# Patient Record
Sex: Male | Born: 1935 | Hispanic: No | Marital: Married | Smoking: Never smoker
Health system: Southern US, Community
[De-identification: ages and names within clinical notes are randomized; demographics above are authoritative.]

## PROBLEM LIST (undated history)

## (undated) DIAGNOSIS — I219 Acute myocardial infarction, unspecified: Secondary | ICD-10-CM

## (undated) DIAGNOSIS — N400 Enlarged prostate without lower urinary tract symptoms: Secondary | ICD-10-CM

## (undated) DIAGNOSIS — I2510 Atherosclerotic heart disease of native coronary artery without angina pectoris: Secondary | ICD-10-CM

## (undated) DIAGNOSIS — I639 Cerebral infarction, unspecified: Secondary | ICD-10-CM

## (undated) DIAGNOSIS — I10 Essential (primary) hypertension: Secondary | ICD-10-CM

## (undated) DIAGNOSIS — R339 Retention of urine, unspecified: Secondary | ICD-10-CM

## (undated) DIAGNOSIS — J90 Pleural effusion, not elsewhere classified: Secondary | ICD-10-CM

## (undated) DIAGNOSIS — I5020 Unspecified systolic (congestive) heart failure: Secondary | ICD-10-CM

## (undated) HISTORY — PX: APPENDECTOMY: SHX54

## (undated) HISTORY — PX: TOTAL HIP ARTHROPLASTY: SHX124

---

## 2010-08-06 ENCOUNTER — Ambulatory Visit (HOSPITAL_BASED_OUTPATIENT_CLINIC_OR_DEPARTMENT_OTHER)
Admission: RE | Admit: 2010-08-06 | Discharge: 2010-08-06 | Disposition: A | Discharge status: Home / Self Care | Payer: Medicare Other | Source: Ambulatory Visit | Attending: Urology | Admitting: Urology

## 2010-08-06 ENCOUNTER — Other Ambulatory Visit: Payer: Self-pay | Admitting: Urology

## 2010-08-06 DIAGNOSIS — N329 Bladder disorder, unspecified: Secondary | ICD-10-CM | POA: Insufficient documentation

## 2010-08-06 DIAGNOSIS — I10 Essential (primary) hypertension: Secondary | ICD-10-CM | POA: Insufficient documentation

## 2010-08-06 DIAGNOSIS — R350 Frequency of micturition: Secondary | ICD-10-CM | POA: Insufficient documentation

## 2010-08-06 DIAGNOSIS — R3915 Urgency of urination: Secondary | ICD-10-CM | POA: Insufficient documentation

## 2010-08-06 DIAGNOSIS — R300 Dysuria: Secondary | ICD-10-CM | POA: Insufficient documentation

## 2010-08-06 DIAGNOSIS — N138 Other obstructive and reflux uropathy: Secondary | ICD-10-CM | POA: Insufficient documentation

## 2010-08-06 DIAGNOSIS — N401 Enlarged prostate with lower urinary tract symptoms: Secondary | ICD-10-CM | POA: Insufficient documentation

## 2010-08-06 DIAGNOSIS — N3289 Other specified disorders of bladder: Secondary | ICD-10-CM | POA: Insufficient documentation

## 2010-08-06 LAB — POCT I-STAT 4, (NA,K, GLUC, HGB,HCT)
Glucose, Bld: 157 mg/dL — ABNORMAL HIGH (ref 70–99)
HCT: 41 % (ref 39.0–52.0)
Hemoglobin: 13.9 g/dL (ref 13.0–17.0)
Potassium: 4 mEq/L (ref 3.5–5.1)
Sodium: 140 mEq/L (ref 135–145)

## 2010-08-06 LAB — GLUCOSE, CAPILLARY: Glucose-Capillary: 168 mg/dL — ABNORMAL HIGH (ref 70–99)

## 2010-08-07 NOTE — Op Note (Signed)
NAME:  Travis Costa, Travis Costa NO.:  [id number]  MEDICAL RECORD NO.:  [id number]  LOCATION:                                 FACILITY:  PHYSICIAN:  Jaimi Belle C. Vernie Ammons, M.D.       DATE OF BIRTH:  DATE OF PROCEDURE:  08/06/2010 DATE OF DISCHARGE:                              OPERATIVE REPORT   PREOPERATIVE DIAGNOSIS:  Bladder lesion.  POSTOPERATIVE DIAGNOSIS:  Bladder lesion.  PROCEDURE:  Transurethral resectional biopsy of bladder lesion.  SURGEON:  Esvin Hnat C. Vernie Ammons, M.D.  ANESTHESIA:  General.  DRAINS:  A 26-French Foley catheter.  SPECIMENS:  Portion of bladder lesion to pathology.  BLOOD LOSS:  Minimal.  COMPLICATIONS:  None.  INDICATIONS:  The patient is an 77 year old who has recently developed increased urgency, frequency and dysuria.  He was treated medically initially but due to persistent symptoms cystoscopy was performed which revealed an area on the floor of the bladder on the right hand side that appeared to be slightly erythematous and the question whether inflammation versus early papillary transitional cell carcinoma was raised.  I therefore have discussed further evaluation of this under anesthesia with resectional biopsy.  The risks, complications and alternatives have been discussed.  The patient understands and has elected to proceed.  DESCRIPTION OF OPERATION:  After informed consent, the patient was brought to the major OR, placed on the table and administered general anesthesia.  He was then moved to the dorsal lithotomy position and his genitalia was sterilely prepped and draped.  An official time-out was then performed.  The 28-French resectoscope sheath with Timberlake obturator was then passed into the bladder and the obturator removed and replaced with the 12 degrees lens and resectoscope element.  I then did a thorough inspection of the bladder.  I noted 3+ trabeculation with some pseudodiverticula/cellules.  The area that I had  seen previously appeared slightly erythematous.  There was also what appeared to be either edema of the bladder neck or early transitional cell carcinoma primarily involving the bladder neck region and floor of the bladder at approximately the 7 to 8 o'clock position.  No other worrisome lesions were found within the bladder.  I resected the worrisome area at the bladder neck and on the floor of the bladder.  I then used the Microvasive evacuator to remove all of the portions of the resected lesion.  He was noted to have significant trilobar hypertrophy with great many superficial prostatic urethral vessels that did bleed secondary to passage of the scope.  I therefore fulgurated the area where I had resected the bladder neck and this controlled bleeding there, however, there was still bleeding from the prostatic urethra itself.  Rather than try to fulgurate all of the prostatic urethra, I elected rather to place a large-bore Foley catheter and placed this on mild traction.  Doing this, I was able to control the bleeding and this was maintained on traction and the patient was awakened and taken to recovery room in stable and satisfactory condition.  He tolerated the procedure well and there were no intraoperative complications.  He will be observed in the recovery room with the catheter in and it will  be irrigated to be sure no clots form.  If it remains clear enough, the catheter could be removed after it has been off traction for a period of time and he will be discharged home.  Otherwise, I may want to leave the catheter in and then remove it when he returns to the office.     Aleda Madl C. Vernie Ammons, M.D.     MCO/MEDQ  D:  08/06/2010  T:  08/06/2010  Job:  [id number]  Electronically Signed by Ihor Gully M.D. on 08/07/2010 10:26:23 AM

## 2011-02-07 DIAGNOSIS — H401290 Low-tension glaucoma, unspecified eye, stage unspecified: Secondary | ICD-10-CM | POA: Diagnosis not present

## 2011-02-14 DIAGNOSIS — N401 Enlarged prostate with lower urinary tract symptoms: Secondary | ICD-10-CM | POA: Diagnosis not present

## 2011-02-14 DIAGNOSIS — R972 Elevated prostate specific antigen [PSA]: Secondary | ICD-10-CM | POA: Diagnosis not present

## 2011-02-14 DIAGNOSIS — R339 Retention of urine, unspecified: Secondary | ICD-10-CM | POA: Diagnosis not present

## 2011-02-15 DIAGNOSIS — H401290 Low-tension glaucoma, unspecified eye, stage unspecified: Secondary | ICD-10-CM | POA: Diagnosis not present

## 2011-05-10 DIAGNOSIS — I10 Essential (primary) hypertension: Secondary | ICD-10-CM | POA: Diagnosis not present

## 2011-05-10 DIAGNOSIS — E119 Type 2 diabetes mellitus without complications: Secondary | ICD-10-CM | POA: Diagnosis not present

## 2011-05-10 DIAGNOSIS — Z79899 Other long term (current) drug therapy: Secondary | ICD-10-CM | POA: Diagnosis not present

## 2011-05-10 DIAGNOSIS — J3089 Other allergic rhinitis: Secondary | ICD-10-CM | POA: Diagnosis not present

## 2011-05-10 DIAGNOSIS — E782 Mixed hyperlipidemia: Secondary | ICD-10-CM | POA: Diagnosis not present

## 2011-08-29 DIAGNOSIS — H401290 Low-tension glaucoma, unspecified eye, stage unspecified: Secondary | ICD-10-CM | POA: Diagnosis not present

## 2011-12-24 DIAGNOSIS — I10 Essential (primary) hypertension: Secondary | ICD-10-CM | POA: Diagnosis not present

## 2011-12-24 DIAGNOSIS — R42 Dizziness and giddiness: Secondary | ICD-10-CM | POA: Diagnosis not present

## 2011-12-24 DIAGNOSIS — E782 Mixed hyperlipidemia: Secondary | ICD-10-CM | POA: Diagnosis not present

## 2011-12-24 DIAGNOSIS — E119 Type 2 diabetes mellitus without complications: Secondary | ICD-10-CM | POA: Diagnosis not present

## 2012-01-02 DIAGNOSIS — H401290 Low-tension glaucoma, unspecified eye, stage unspecified: Secondary | ICD-10-CM | POA: Diagnosis not present

## 2012-04-08 DIAGNOSIS — R972 Elevated prostate specific antigen [PSA]: Secondary | ICD-10-CM | POA: Diagnosis not present

## 2012-04-08 DIAGNOSIS — R39198 Other difficulties with micturition: Secondary | ICD-10-CM | POA: Diagnosis not present

## 2012-04-08 DIAGNOSIS — N401 Enlarged prostate with lower urinary tract symptoms: Secondary | ICD-10-CM | POA: Diagnosis not present

## 2012-04-08 DIAGNOSIS — R339 Retention of urine, unspecified: Secondary | ICD-10-CM | POA: Diagnosis not present

## 2012-04-08 DIAGNOSIS — N138 Other obstructive and reflux uropathy: Secondary | ICD-10-CM | POA: Diagnosis not present

## 2012-05-07 DIAGNOSIS — H401290 Low-tension glaucoma, unspecified eye, stage unspecified: Secondary | ICD-10-CM | POA: Diagnosis not present

## 2012-06-17 DIAGNOSIS — H1010 Acute atopic conjunctivitis, unspecified eye: Secondary | ICD-10-CM | POA: Diagnosis not present

## 2012-06-25 DIAGNOSIS — I10 Essential (primary) hypertension: Secondary | ICD-10-CM | POA: Diagnosis not present

## 2012-06-25 DIAGNOSIS — Z79899 Other long term (current) drug therapy: Secondary | ICD-10-CM | POA: Diagnosis not present

## 2012-06-25 DIAGNOSIS — E119 Type 2 diabetes mellitus without complications: Secondary | ICD-10-CM | POA: Diagnosis not present

## 2012-06-25 DIAGNOSIS — E782 Mixed hyperlipidemia: Secondary | ICD-10-CM | POA: Diagnosis not present

## 2012-07-24 DIAGNOSIS — I10 Essential (primary) hypertension: Secondary | ICD-10-CM | POA: Diagnosis not present

## 2012-07-24 DIAGNOSIS — Z006 Encounter for examination for normal comparison and control in clinical research program: Secondary | ICD-10-CM | POA: Diagnosis not present

## 2012-07-24 DIAGNOSIS — E119 Type 2 diabetes mellitus without complications: Secondary | ICD-10-CM | POA: Diagnosis not present

## 2012-07-24 DIAGNOSIS — Z0000 Encounter for general adult medical examination without abnormal findings: Secondary | ICD-10-CM | POA: Diagnosis not present

## 2012-09-10 DIAGNOSIS — H401290 Low-tension glaucoma, unspecified eye, stage unspecified: Secondary | ICD-10-CM | POA: Diagnosis not present

## 2012-10-22 DIAGNOSIS — I10 Essential (primary) hypertension: Secondary | ICD-10-CM | POA: Diagnosis not present

## 2013-01-11 DIAGNOSIS — H401290 Low-tension glaucoma, unspecified eye, stage unspecified: Secondary | ICD-10-CM | POA: Diagnosis not present

## 2013-04-13 DIAGNOSIS — N138 Other obstructive and reflux uropathy: Secondary | ICD-10-CM | POA: Diagnosis not present

## 2013-04-13 DIAGNOSIS — N401 Enlarged prostate with lower urinary tract symptoms: Secondary | ICD-10-CM | POA: Diagnosis not present

## 2013-04-13 DIAGNOSIS — R972 Elevated prostate specific antigen [PSA]: Secondary | ICD-10-CM | POA: Diagnosis not present

## 2013-04-29 DIAGNOSIS — E782 Mixed hyperlipidemia: Secondary | ICD-10-CM | POA: Diagnosis not present

## 2013-04-29 DIAGNOSIS — K219 Gastro-esophageal reflux disease without esophagitis: Secondary | ICD-10-CM | POA: Diagnosis not present

## 2013-04-29 DIAGNOSIS — Z79899 Other long term (current) drug therapy: Secondary | ICD-10-CM | POA: Diagnosis not present

## 2013-04-29 DIAGNOSIS — I10 Essential (primary) hypertension: Secondary | ICD-10-CM | POA: Diagnosis not present

## 2013-04-29 DIAGNOSIS — Z23 Encounter for immunization: Secondary | ICD-10-CM | POA: Diagnosis not present

## 2013-04-29 DIAGNOSIS — IMO0001 Reserved for inherently not codable concepts without codable children: Secondary | ICD-10-CM | POA: Diagnosis not present

## 2013-05-13 DIAGNOSIS — H401290 Low-tension glaucoma, unspecified eye, stage unspecified: Secondary | ICD-10-CM | POA: Diagnosis not present

## 2013-08-31 DIAGNOSIS — E782 Mixed hyperlipidemia: Secondary | ICD-10-CM | POA: Diagnosis not present

## 2013-08-31 DIAGNOSIS — Z0000 Encounter for general adult medical examination without abnormal findings: Secondary | ICD-10-CM | POA: Diagnosis not present

## 2013-08-31 DIAGNOSIS — IMO0001 Reserved for inherently not codable concepts without codable children: Secondary | ICD-10-CM | POA: Diagnosis not present

## 2013-08-31 DIAGNOSIS — I10 Essential (primary) hypertension: Secondary | ICD-10-CM | POA: Diagnosis not present

## 2013-09-09 DIAGNOSIS — H401290 Low-tension glaucoma, unspecified eye, stage unspecified: Secondary | ICD-10-CM | POA: Diagnosis not present

## 2013-12-03 DIAGNOSIS — S61412A Laceration without foreign body of left hand, initial encounter: Secondary | ICD-10-CM | POA: Diagnosis not present

## 2014-02-16 DIAGNOSIS — M179 Osteoarthritis of knee, unspecified: Secondary | ICD-10-CM | POA: Diagnosis not present

## 2014-03-14 DIAGNOSIS — H401231 Low-tension glaucoma, bilateral, mild stage: Secondary | ICD-10-CM | POA: Diagnosis not present

## 2014-03-25 DIAGNOSIS — E782 Mixed hyperlipidemia: Secondary | ICD-10-CM | POA: Diagnosis not present

## 2014-03-25 DIAGNOSIS — I10 Essential (primary) hypertension: Secondary | ICD-10-CM | POA: Diagnosis not present

## 2014-03-25 DIAGNOSIS — E1165 Type 2 diabetes mellitus with hyperglycemia: Secondary | ICD-10-CM | POA: Diagnosis not present

## 2014-03-25 DIAGNOSIS — K219 Gastro-esophageal reflux disease without esophagitis: Secondary | ICD-10-CM | POA: Diagnosis not present

## 2014-03-25 DIAGNOSIS — Z79899 Other long term (current) drug therapy: Secondary | ICD-10-CM | POA: Diagnosis not present

## 2014-08-01 DIAGNOSIS — R42 Dizziness and giddiness: Secondary | ICD-10-CM | POA: Diagnosis not present

## 2014-08-01 DIAGNOSIS — I10 Essential (primary) hypertension: Secondary | ICD-10-CM | POA: Diagnosis not present

## 2014-08-15 DIAGNOSIS — E1165 Type 2 diabetes mellitus with hyperglycemia: Secondary | ICD-10-CM | POA: Diagnosis not present

## 2014-08-15 DIAGNOSIS — E1169 Type 2 diabetes mellitus with other specified complication: Secondary | ICD-10-CM | POA: Diagnosis not present

## 2014-08-15 DIAGNOSIS — I10 Essential (primary) hypertension: Secondary | ICD-10-CM | POA: Diagnosis not present

## 2014-08-15 DIAGNOSIS — Z79899 Other long term (current) drug therapy: Secondary | ICD-10-CM | POA: Diagnosis not present

## 2014-09-13 DIAGNOSIS — H401231 Low-tension glaucoma, bilateral, mild stage: Secondary | ICD-10-CM | POA: Diagnosis not present

## 2014-09-15 DIAGNOSIS — I219 Acute myocardial infarction, unspecified: Secondary | ICD-10-CM

## 2014-09-15 HISTORY — DX: Acute myocardial infarction, unspecified: I21.9

## 2014-09-18 DIAGNOSIS — R338 Other retention of urine: Secondary | ICD-10-CM | POA: Diagnosis present

## 2014-09-18 DIAGNOSIS — S70221A Blister (nonthermal), right hip, initial encounter: Secondary | ICD-10-CM | POA: Diagnosis present

## 2014-09-18 DIAGNOSIS — Z8673 Personal history of transient ischemic attack (TIA), and cerebral infarction without residual deficits: Secondary | ICD-10-CM | POA: Diagnosis not present

## 2014-09-18 DIAGNOSIS — S72142A Displaced intertrochanteric fracture of left femur, initial encounter for closed fracture: Secondary | ICD-10-CM | POA: Diagnosis not present

## 2014-09-18 DIAGNOSIS — M25551 Pain in right hip: Secondary | ICD-10-CM | POA: Diagnosis not present

## 2014-09-18 DIAGNOSIS — Z043 Encounter for examination and observation following other accident: Secondary | ICD-10-CM | POA: Diagnosis not present

## 2014-09-18 DIAGNOSIS — I340 Nonrheumatic mitral (valve) insufficiency: Secondary | ICD-10-CM | POA: Diagnosis not present

## 2014-09-18 DIAGNOSIS — E40 Kwashiorkor: Secondary | ICD-10-CM | POA: Diagnosis not present

## 2014-09-18 DIAGNOSIS — R7989 Other specified abnormal findings of blood chemistry: Secondary | ICD-10-CM | POA: Diagnosis not present

## 2014-09-18 DIAGNOSIS — I771 Stricture of artery: Secondary | ICD-10-CM | POA: Diagnosis not present

## 2014-09-18 DIAGNOSIS — G9389 Other specified disorders of brain: Secondary | ICD-10-CM | POA: Diagnosis not present

## 2014-09-18 DIAGNOSIS — I639 Cerebral infarction, unspecified: Secondary | ICD-10-CM | POA: Diagnosis not present

## 2014-09-18 DIAGNOSIS — R312 Other microscopic hematuria: Secondary | ICD-10-CM | POA: Diagnosis not present

## 2014-09-18 DIAGNOSIS — I10 Essential (primary) hypertension: Secondary | ICD-10-CM | POA: Diagnosis present

## 2014-09-18 DIAGNOSIS — I249 Acute ischemic heart disease, unspecified: Secondary | ICD-10-CM | POA: Diagnosis not present

## 2014-09-18 DIAGNOSIS — E440 Moderate protein-calorie malnutrition: Secondary | ICD-10-CM | POA: Diagnosis present

## 2014-09-18 DIAGNOSIS — I491 Atrial premature depolarization: Secondary | ICD-10-CM | POA: Diagnosis not present

## 2014-09-18 DIAGNOSIS — N401 Enlarged prostate with lower urinary tract symptoms: Secondary | ICD-10-CM | POA: Diagnosis present

## 2014-09-18 DIAGNOSIS — I6782 Cerebral ischemia: Secondary | ICD-10-CM | POA: Diagnosis not present

## 2014-09-18 DIAGNOSIS — E119 Type 2 diabetes mellitus without complications: Secondary | ICD-10-CM | POA: Diagnosis present

## 2014-09-18 DIAGNOSIS — R9431 Abnormal electrocardiogram [ECG] [EKG]: Secondary | ICD-10-CM | POA: Diagnosis not present

## 2014-09-18 DIAGNOSIS — I2510 Atherosclerotic heart disease of native coronary artery without angina pectoris: Secondary | ICD-10-CM | POA: Diagnosis not present

## 2014-09-18 DIAGNOSIS — R918 Other nonspecific abnormal finding of lung field: Secondary | ICD-10-CM | POA: Diagnosis not present

## 2014-09-18 DIAGNOSIS — I6522 Occlusion and stenosis of left carotid artery: Secondary | ICD-10-CM | POA: Diagnosis present

## 2014-09-18 DIAGNOSIS — G319 Degenerative disease of nervous system, unspecified: Secondary | ICD-10-CM | POA: Diagnosis not present

## 2014-09-18 DIAGNOSIS — R54 Age-related physical debility: Secondary | ICD-10-CM | POA: Diagnosis not present

## 2014-09-18 DIAGNOSIS — T148 Other injury of unspecified body region: Secondary | ICD-10-CM | POA: Diagnosis not present

## 2014-09-18 DIAGNOSIS — I214 Non-ST elevation (NSTEMI) myocardial infarction: Secondary | ICD-10-CM | POA: Diagnosis not present

## 2014-09-18 DIAGNOSIS — S80811A Abrasion, right lower leg, initial encounter: Secondary | ICD-10-CM | POA: Diagnosis not present

## 2014-09-18 DIAGNOSIS — R52 Pain, unspecified: Secondary | ICD-10-CM | POA: Diagnosis not present

## 2014-09-18 DIAGNOSIS — Z6823 Body mass index (BMI) 23.0-23.9, adult: Secondary | ICD-10-CM | POA: Diagnosis not present

## 2014-09-18 DIAGNOSIS — S72141A Displaced intertrochanteric fracture of right femur, initial encounter for closed fracture: Secondary | ICD-10-CM | POA: Diagnosis not present

## 2014-09-18 DIAGNOSIS — S79911A Unspecified injury of right hip, initial encounter: Secondary | ICD-10-CM | POA: Diagnosis not present

## 2014-09-18 DIAGNOSIS — R7889 Finding of other specified substances, not normally found in blood: Secondary | ICD-10-CM | POA: Diagnosis not present

## 2014-09-18 DIAGNOSIS — R509 Fever, unspecified: Secondary | ICD-10-CM | POA: Diagnosis not present

## 2014-09-23 DIAGNOSIS — S72142A Displaced intertrochanteric fracture of left femur, initial encounter for closed fracture: Secondary | ICD-10-CM | POA: Diagnosis not present

## 2014-09-27 DIAGNOSIS — I77810 Thoracic aortic ectasia: Secondary | ICD-10-CM | POA: Diagnosis not present

## 2014-09-27 DIAGNOSIS — I700 Atherosclerosis of aorta: Secondary | ICD-10-CM | POA: Diagnosis not present

## 2014-09-27 DIAGNOSIS — I959 Hypotension, unspecified: Secondary | ICD-10-CM | POA: Diagnosis present

## 2014-09-27 DIAGNOSIS — R310 Gross hematuria: Secondary | ICD-10-CM | POA: Diagnosis not present

## 2014-09-27 DIAGNOSIS — R000 Tachycardia, unspecified: Secondary | ICD-10-CM | POA: Diagnosis not present

## 2014-09-27 DIAGNOSIS — Z9181 History of falling: Secondary | ICD-10-CM | POA: Diagnosis not present

## 2014-09-27 DIAGNOSIS — D62 Acute posthemorrhagic anemia: Secondary | ICD-10-CM | POA: Diagnosis not present

## 2014-09-27 DIAGNOSIS — N390 Urinary tract infection, site not specified: Secondary | ICD-10-CM | POA: Diagnosis not present

## 2014-09-27 DIAGNOSIS — Z09 Encounter for follow-up examination after completed treatment for conditions other than malignant neoplasm: Secondary | ICD-10-CM | POA: Diagnosis not present

## 2014-09-27 DIAGNOSIS — I6522 Occlusion and stenosis of left carotid artery: Secondary | ICD-10-CM | POA: Diagnosis present

## 2014-09-27 DIAGNOSIS — I771 Stricture of artery: Secondary | ICD-10-CM | POA: Diagnosis not present

## 2014-09-27 DIAGNOSIS — E871 Hypo-osmolality and hyponatremia: Secondary | ICD-10-CM | POA: Diagnosis present

## 2014-09-27 DIAGNOSIS — J90 Pleural effusion, not elsewhere classified: Secondary | ICD-10-CM | POA: Diagnosis present

## 2014-09-27 DIAGNOSIS — J9811 Atelectasis: Secondary | ICD-10-CM | POA: Diagnosis not present

## 2014-09-27 DIAGNOSIS — I491 Atrial premature depolarization: Secondary | ICD-10-CM | POA: Diagnosis not present

## 2014-09-27 DIAGNOSIS — R339 Retention of urine, unspecified: Secondary | ICD-10-CM | POA: Diagnosis not present

## 2014-09-27 DIAGNOSIS — N209 Urinary calculus, unspecified: Secondary | ICD-10-CM | POA: Diagnosis not present

## 2014-09-27 DIAGNOSIS — A419 Sepsis, unspecified organism: Secondary | ICD-10-CM | POA: Diagnosis not present

## 2014-09-27 DIAGNOSIS — E119 Type 2 diabetes mellitus without complications: Secondary | ICD-10-CM | POA: Diagnosis not present

## 2014-09-27 DIAGNOSIS — I5022 Chronic systolic (congestive) heart failure: Secondary | ICD-10-CM | POA: Diagnosis not present

## 2014-09-27 DIAGNOSIS — R0602 Shortness of breath: Secondary | ICD-10-CM | POA: Diagnosis not present

## 2014-09-27 DIAGNOSIS — M7989 Other specified soft tissue disorders: Secondary | ICD-10-CM | POA: Diagnosis not present

## 2014-09-27 DIAGNOSIS — R319 Hematuria, unspecified: Secondary | ICD-10-CM | POA: Diagnosis not present

## 2014-09-27 DIAGNOSIS — S72143A Displaced intertrochanteric fracture of unspecified femur, initial encounter for closed fracture: Secondary | ICD-10-CM | POA: Diagnosis not present

## 2014-09-27 DIAGNOSIS — N401 Enlarged prostate with lower urinary tract symptoms: Secondary | ICD-10-CM | POA: Diagnosis present

## 2014-09-27 DIAGNOSIS — Z8673 Personal history of transient ischemic attack (TIA), and cerebral infarction without residual deficits: Secondary | ICD-10-CM | POA: Diagnosis not present

## 2014-09-27 DIAGNOSIS — S72141A Displaced intertrochanteric fracture of right femur, initial encounter for closed fracture: Secondary | ICD-10-CM | POA: Diagnosis not present

## 2014-09-27 DIAGNOSIS — I2510 Atherosclerotic heart disease of native coronary artery without angina pectoris: Secondary | ICD-10-CM | POA: Diagnosis present

## 2014-09-27 DIAGNOSIS — S72144D Nondisplaced intertrochanteric fracture of right femur, subsequent encounter for closed fracture with routine healing: Secondary | ICD-10-CM | POA: Diagnosis not present

## 2014-09-27 DIAGNOSIS — I2109 ST elevation (STEMI) myocardial infarction involving other coronary artery of anterior wall: Secondary | ICD-10-CM | POA: Diagnosis not present

## 2014-09-27 DIAGNOSIS — I493 Ventricular premature depolarization: Secondary | ICD-10-CM | POA: Diagnosis not present

## 2014-09-27 DIAGNOSIS — R9431 Abnormal electrocardiogram [ECG] [EKG]: Secondary | ICD-10-CM | POA: Diagnosis not present

## 2014-09-27 DIAGNOSIS — R509 Fever, unspecified: Secondary | ICD-10-CM | POA: Diagnosis not present

## 2014-09-27 DIAGNOSIS — D649 Anemia, unspecified: Secondary | ICD-10-CM | POA: Diagnosis present

## 2014-09-27 DIAGNOSIS — Z5189 Encounter for other specified aftercare: Secondary | ICD-10-CM | POA: Diagnosis not present

## 2014-09-27 DIAGNOSIS — I255 Ischemic cardiomyopathy: Secondary | ICD-10-CM | POA: Diagnosis present

## 2014-09-27 DIAGNOSIS — N3000 Acute cystitis without hematuria: Secondary | ICD-10-CM | POA: Diagnosis not present

## 2014-09-27 DIAGNOSIS — J811 Chronic pulmonary edema: Secondary | ICD-10-CM | POA: Diagnosis not present

## 2014-09-27 DIAGNOSIS — S72009A Fracture of unspecified part of neck of unspecified femur, initial encounter for closed fracture: Secondary | ICD-10-CM | POA: Diagnosis not present

## 2014-09-27 DIAGNOSIS — R938 Abnormal findings on diagnostic imaging of other specified body structures: Secondary | ICD-10-CM | POA: Diagnosis not present

## 2014-09-27 DIAGNOSIS — R918 Other nonspecific abnormal finding of lung field: Secondary | ICD-10-CM | POA: Diagnosis not present

## 2014-09-27 DIAGNOSIS — J984 Other disorders of lung: Secondary | ICD-10-CM | POA: Diagnosis not present

## 2014-09-27 DIAGNOSIS — I10 Essential (primary) hypertension: Secondary | ICD-10-CM | POA: Diagnosis present

## 2014-09-27 DIAGNOSIS — I509 Heart failure, unspecified: Secondary | ICD-10-CM | POA: Diagnosis not present

## 2014-09-27 DIAGNOSIS — R091 Pleurisy: Secondary | ICD-10-CM | POA: Diagnosis not present

## 2014-09-27 DIAGNOSIS — R338 Other retention of urine: Secondary | ICD-10-CM | POA: Diagnosis not present

## 2014-09-27 DIAGNOSIS — I214 Non-ST elevation (NSTEMI) myocardial infarction: Secondary | ICD-10-CM | POA: Diagnosis not present

## 2014-09-27 DIAGNOSIS — F0390 Unspecified dementia without behavioral disturbance: Secondary | ICD-10-CM | POA: Diagnosis present

## 2014-09-27 DIAGNOSIS — E1165 Type 2 diabetes mellitus with hyperglycemia: Secondary | ICD-10-CM | POA: Diagnosis present

## 2014-09-27 DIAGNOSIS — I5023 Acute on chronic systolic (congestive) heart failure: Secondary | ICD-10-CM | POA: Diagnosis not present

## 2014-10-14 DIAGNOSIS — E119 Type 2 diabetes mellitus without complications: Secondary | ICD-10-CM | POA: Diagnosis present

## 2014-10-14 DIAGNOSIS — I6522 Occlusion and stenosis of left carotid artery: Secondary | ICD-10-CM | POA: Diagnosis present

## 2014-10-14 DIAGNOSIS — R319 Hematuria, unspecified: Secondary | ICD-10-CM | POA: Diagnosis not present

## 2014-10-14 DIAGNOSIS — N401 Enlarged prostate with lower urinary tract symptoms: Secondary | ICD-10-CM | POA: Diagnosis present

## 2014-10-14 DIAGNOSIS — I214 Non-ST elevation (NSTEMI) myocardial infarction: Secondary | ICD-10-CM | POA: Diagnosis present

## 2014-10-14 DIAGNOSIS — Z7982 Long term (current) use of aspirin: Secondary | ICD-10-CM | POA: Diagnosis not present

## 2014-10-14 DIAGNOSIS — I69198 Other sequelae of nontraumatic intracerebral hemorrhage: Secondary | ICD-10-CM | POA: Diagnosis not present

## 2014-10-14 DIAGNOSIS — E7800 Pure hypercholesterolemia, unspecified: Secondary | ICD-10-CM | POA: Diagnosis present

## 2014-10-14 DIAGNOSIS — Z8673 Personal history of transient ischemic attack (TIA), and cerebral infarction without residual deficits: Secondary | ICD-10-CM | POA: Diagnosis not present

## 2014-10-14 DIAGNOSIS — G9389 Other specified disorders of brain: Secondary | ICD-10-CM | POA: Diagnosis present

## 2014-10-14 DIAGNOSIS — I2510 Atherosclerotic heart disease of native coronary artery without angina pectoris: Secondary | ICD-10-CM | POA: Diagnosis present

## 2014-10-14 DIAGNOSIS — R338 Other retention of urine: Secondary | ICD-10-CM | POA: Diagnosis present

## 2014-10-14 DIAGNOSIS — D62 Acute posthemorrhagic anemia: Secondary | ICD-10-CM | POA: Diagnosis present

## 2014-10-14 DIAGNOSIS — I5022 Chronic systolic (congestive) heart failure: Secondary | ICD-10-CM | POA: Diagnosis present

## 2014-10-14 DIAGNOSIS — I10 Essential (primary) hypertension: Secondary | ICD-10-CM | POA: Diagnosis present

## 2014-10-16 ENCOUNTER — Encounter (HOSPITAL_COMMUNITY): Payer: Self-pay | Admitting: Internal Medicine

## 2014-10-16 ENCOUNTER — Inpatient Hospital Stay (HOSPITAL_COMMUNITY)
Admission: AD | Admit: 2014-10-16 | Discharge: 2014-10-21 | DRG: 726 | Disposition: A | Discharge status: Home Health | Payer: Medicare Other | Source: Other Acute Inpatient Hospital | Attending: Internal Medicine | Admitting: Internal Medicine

## 2014-10-16 DIAGNOSIS — N202 Calculus of kidney with calculus of ureter: Secondary | ICD-10-CM | POA: Diagnosis not present

## 2014-10-16 DIAGNOSIS — R05 Cough: Secondary | ICD-10-CM

## 2014-10-16 DIAGNOSIS — I10 Essential (primary) hypertension: Secondary | ICD-10-CM | POA: Diagnosis not present

## 2014-10-16 DIAGNOSIS — I5020 Unspecified systolic (congestive) heart failure: Secondary | ICD-10-CM | POA: Diagnosis present

## 2014-10-16 DIAGNOSIS — I2510 Atherosclerotic heart disease of native coronary artery without angina pectoris: Secondary | ICD-10-CM | POA: Diagnosis present

## 2014-10-16 DIAGNOSIS — Z885 Allergy status to narcotic agent status: Secondary | ICD-10-CM

## 2014-10-16 DIAGNOSIS — R059 Cough, unspecified: Secondary | ICD-10-CM

## 2014-10-16 DIAGNOSIS — I252 Old myocardial infarction: Secondary | ICD-10-CM | POA: Diagnosis not present

## 2014-10-16 DIAGNOSIS — Z96641 Presence of right artificial hip joint: Secondary | ICD-10-CM | POA: Diagnosis present

## 2014-10-16 DIAGNOSIS — Z8701 Personal history of pneumonia (recurrent): Secondary | ICD-10-CM

## 2014-10-16 DIAGNOSIS — I639 Cerebral infarction, unspecified: Secondary | ICD-10-CM | POA: Diagnosis present

## 2014-10-16 DIAGNOSIS — N400 Enlarged prostate without lower urinary tract symptoms: Secondary | ICD-10-CM | POA: Diagnosis not present

## 2014-10-16 DIAGNOSIS — Z833 Family history of diabetes mellitus: Secondary | ICD-10-CM | POA: Diagnosis not present

## 2014-10-16 DIAGNOSIS — I959 Hypotension, unspecified: Secondary | ICD-10-CM | POA: Diagnosis present

## 2014-10-16 DIAGNOSIS — R339 Retention of urine, unspecified: Secondary | ICD-10-CM | POA: Diagnosis present

## 2014-10-16 DIAGNOSIS — I9589 Other hypotension: Secondary | ICD-10-CM

## 2014-10-16 DIAGNOSIS — Z8673 Personal history of transient ischemic attack (TIA), and cerebral infarction without residual deficits: Secondary | ICD-10-CM | POA: Diagnosis not present

## 2014-10-16 DIAGNOSIS — D62 Acute posthemorrhagic anemia: Secondary | ICD-10-CM | POA: Insufficient documentation

## 2014-10-16 DIAGNOSIS — I5022 Chronic systolic (congestive) heart failure: Secondary | ICD-10-CM | POA: Diagnosis present

## 2014-10-16 DIAGNOSIS — R338 Other retention of urine: Secondary | ICD-10-CM | POA: Diagnosis not present

## 2014-10-16 DIAGNOSIS — I110 Hypertensive heart disease with heart failure: Secondary | ICD-10-CM | POA: Diagnosis present

## 2014-10-16 DIAGNOSIS — N401 Enlarged prostate with lower urinary tract symptoms: Principal | ICD-10-CM | POA: Diagnosis present

## 2014-10-16 DIAGNOSIS — N390 Urinary tract infection, site not specified: Secondary | ICD-10-CM | POA: Diagnosis not present

## 2014-10-16 DIAGNOSIS — E119 Type 2 diabetes mellitus without complications: Secondary | ICD-10-CM | POA: Diagnosis present

## 2014-10-16 DIAGNOSIS — R319 Hematuria, unspecified: Secondary | ICD-10-CM | POA: Diagnosis present

## 2014-10-16 DIAGNOSIS — R310 Gross hematuria: Secondary | ICD-10-CM

## 2014-10-16 DIAGNOSIS — R972 Elevated prostate specific antigen [PSA]: Secondary | ICD-10-CM | POA: Diagnosis not present

## 2014-10-16 DIAGNOSIS — I6350 Cerebral infarction due to unspecified occlusion or stenosis of unspecified cerebral artery: Secondary | ICD-10-CM | POA: Diagnosis not present

## 2014-10-16 DIAGNOSIS — Z8249 Family history of ischemic heart disease and other diseases of the circulatory system: Secondary | ICD-10-CM | POA: Diagnosis not present

## 2014-10-16 HISTORY — DX: Cerebral infarction, unspecified: I63.9

## 2014-10-16 HISTORY — DX: Atherosclerotic heart disease of native coronary artery without angina pectoris: I25.10

## 2014-10-16 HISTORY — DX: Retention of urine, unspecified: R33.9

## 2014-10-16 HISTORY — DX: Unspecified systolic (congestive) heart failure: I50.20

## 2014-10-16 HISTORY — DX: Pleural effusion, not elsewhere classified: J90

## 2014-10-16 HISTORY — DX: Essential (primary) hypertension: I10

## 2014-10-16 HISTORY — DX: Benign prostatic hyperplasia without lower urinary tract symptoms: N40.0

## 2014-10-16 MED ORDER — SODIUM CHLORIDE 0.9 % IJ SOLN
3.0000 mL | Freq: Two times a day (BID) | INTRAMUSCULAR | Status: DC
  Administered 2014-10-17 – 2014-10-21 (×8): 3 mL via INTRAVENOUS

## 2014-10-16 MED ORDER — HYDROMORPHONE HCL 1 MG/ML IJ SOLN: 0.5000 mg | INTRAMUSCULAR | Status: DC | PRN

## 2014-10-16 MED ORDER — DEXTROSE 5 % IV SOLN
1.0000 g | INTRAVENOUS | Status: AC
  Administered 2014-10-17: 1 g via INTRAVENOUS
  Filled 2014-10-16: qty 10

## 2014-10-16 MED ORDER — ONDANSETRON HCL 4 MG PO TABS: 4.0000 mg | ORAL_TABLET | Freq: Four times a day (QID) | ORAL | Status: DC | PRN

## 2014-10-16 MED ORDER — SODIUM CHLORIDE 0.9 % IV SOLN
INTRAVENOUS | Status: DC
  Administered 2014-10-17: 500 mL via INTRAVENOUS

## 2014-10-16 MED ORDER — SODIUM CHLORIDE 0.9 % IV SOLN
INTRAVENOUS | Status: AC
  Administered 2014-10-17: via INTRAVENOUS

## 2014-10-16 MED ORDER — ACETAMINOPHEN 325 MG PO TABS
650.0000 mg | ORAL_TABLET | Freq: Four times a day (QID) | ORAL | Status: DC | PRN
  Administered 2014-10-17 – 2014-10-20 (×4): 650 mg via ORAL
  Filled 2014-10-16 (×4): qty 2

## 2014-10-16 MED ORDER — ALUM & MAG HYDROXIDE-SIMETH 200-200-20 MG/5ML PO SUSP: 30.0000 mL | Freq: Four times a day (QID) | ORAL | Status: DC | PRN

## 2014-10-16 MED ORDER — INSULIN ASPART 100 UNIT/ML ~~LOC~~ SOLN
0.0000 [IU] | SUBCUTANEOUS | Status: DC
  Administered 2014-10-17 (×2): 1 [IU] via SUBCUTANEOUS
  Administered 2014-10-17 (×3): 2 [IU] via SUBCUTANEOUS
  Administered 2014-10-18 (×3): 1 [IU] via SUBCUTANEOUS
  Administered 2014-10-18 – 2014-10-19 (×2): 2 [IU] via SUBCUTANEOUS
  Administered 2014-10-19 (×3): 1 [IU] via SUBCUTANEOUS
  Administered 2014-10-19: 2 [IU] via SUBCUTANEOUS
  Administered 2014-10-20: 3 [IU] via SUBCUTANEOUS
  Administered 2014-10-20: 1 [IU] via SUBCUTANEOUS
  Administered 2014-10-20: 2 [IU] via SUBCUTANEOUS
  Administered 2014-10-20: 1 [IU] via SUBCUTANEOUS
  Administered 2014-10-20 (×2): 2 [IU] via SUBCUTANEOUS
  Administered 2014-10-21: 1 [IU] via SUBCUTANEOUS

## 2014-10-16 MED ORDER — ACETAMINOPHEN 650 MG RE SUPP: 650.0000 mg | Freq: Four times a day (QID) | RECTAL | Status: DC | PRN

## 2014-10-16 MED ORDER — ONDANSETRON HCL 4 MG/2ML IJ SOLN: 4.0000 mg | Freq: Four times a day (QID) | INTRAMUSCULAR | Status: DC | PRN

## 2014-10-16 MED ORDER — SODIUM CHLORIDE 0.9 % IV BOLUS (SEPSIS)
500.0000 mL | Freq: Once | INTRAVENOUS | Status: AC
  Administered 2014-10-17: 500 mL via INTRAVENOUS

## 2014-10-16 NOTE — Consult Note (Signed)
Reason for Consult: Gross Hematuria, Urinary Retention, Elevated PSA  Referring Physician: Oather Costa is an 81 y.o. male.   HPI:   1 - Urinary Retention - new retention after Rt hip surgery 09/19/14. He has h/o on / off retention x years and is s/p prostate needle ablation previously, on tamsulosin at baseline. Started on finasteride 9/27. DRE 80gm+.   2- Gross Hematuria - new gross hematuria with catheter in place following several catheters peri-op around right hip surgery 09/2014. No blood thinners. Three way catheter placed by URology MD at hospital in [facility] and started on finasteride for likely prostatic-source bleeding. No recent abd-pelvis imaging.   3 - Elevated PSA - long h/o elevated PSA since 1990s. Negative BX 1994 and 1998. PSA 17 2015, 14 in 2015, 11 in 2013. DRE this admission 80GM +, non-nodular.  PMH sig for Rt hip fractrue, NSTEMI, CVA (minimal deficits). He is independent at baseline and still works / volunteers few [place] per week.   Today " [place] " is seen in consulatiot for above. He has seen Dr. Karsten Ro on / off previously, but not since 2014.   No past medical history on file.  No past surgical history on file.  No family history on file.  Social History:  has no tobacco, alcohol, and drug history on file.  Allergies: Allergies not on file  Medications: reveiwed as per epic EMR.   No results found for this or any previous visit (from the past 48 hour(s)).  No results found.  Review of Systems  Constitutional: Negative.  Negative for fever and chills.  Eyes: Negative.   Respiratory: Negative.   Cardiovascular: Negative.   Gastrointestinal: Negative.   Genitourinary: Positive for hematuria. Negative for flank pain.  Musculoskeletal: Negative.   Skin: Negative.   Neurological: Negative.   Endo/Heme/Allergies: Negative.  Does not bruise/bleed easily.  Psychiatric/Behavioral: Negative.    Blood pressure 98/62, pulse 93, temperature  97.9 F (36.6 C), temperature source Oral, resp. rate 16, height 5\' 10"  (1.778 m), weight 73.3 kg (161 lb 9.6 oz), SpO2 100 %. Physical Exam  Constitutional: He appears well-developed.  Very mentally sharp for age. Family at bedside  HENT:  Head: Normocephalic.  Eyes: Pupils are equal, round, and reactive to light.  Neck: Normal range of motion.  Cardiovascular: Normal rate.   Respiratory: Effort normal.  GI: Soft.  Genitourinary:  3 way foley in place with light pink urine at slow drip (1 drop per second) DRE 80gm +, smooth  Musculoskeletal: Normal range of motion.  Neurological: He is alert.  Skin: Skin is warm.  Psychiatric: He has a normal mood and affect. His behavior is normal. Judgment and thought content normal.    Assessment/Plan:  1 - Urinary Retention - likely from BPH exacerbated by antispasmodic / anticholinergic effects of recent post-op meds. Agree with tamsulosin + finaseride and consider repeat trial of void in 1-2 weeks (will take few weeks for finasteride to begin to have effect).  2- Gross Hematuria - likleky form large, friable prostate in setting of multiple recent catheters. Urine clearing nicely on very slow irrigation, will try to wean to off. CT abd-pelvis with and without to w/o large GU stone / mass.   Consider transfusion as anemic and with some hypotension / tachycardia, though no frank orthostasis on exam.   3 - Elevated PSA - PSA today to rule out extreme elevation (hundreds), though again likely related to BPH.  We will make Dr. Karsten Ro  aware of pt's admission.    Jaheem Hedgepath 10/16/2014, 11:32 PM

## 2014-10-16 NOTE — Progress Notes (Signed)
81 year old gentleman with  h/o hypertension, had a mechanical fall and presented to [facility] with a hip fracture,  He underwent repair and post op developed a small CVA without any residual deficits. He was later admitted to inpatient rehabilitation, during which he developed urinary retention and hematuria. Urology consulted and foley catheter placed. But he had persistent hematuria and had to admitted back to inpatient side for CBI.  Family requested transfer to [facility] hospital for CBI and further evaluation and management for Hematuria, as pt's primary urologist is Dr Karsten Ro.   Dr Jeffie Pollock on call was called, requested medical admission because of his recent medical issues and he will see the patient in consultation when he arrives.   Patient's VSS and requested transfer to med surg bed.    Travis Costa [id number]

## 2014-10-16 NOTE — H&P (Signed)
[facility] Admission History and Physical       Travis Costa:[id number] DOB: [birth year] DOA: 10/16/2014  Referring physician:  PCP: No primary care provider on file.  Specialists:   Chief Complaint: Urinary Retention with Bloody Urine and Passing Clotts  HPI: Travis Costa is a 81 y.o. male with a history of Previous CVA without residual deficits in 1990, Recent NSTEMI and Small CVA and Thoracentesis for a pleural Effusion following a  Right THA  (on 09/19/2014) after a Mechanical Fall, and history of  HTN and BPH who had been admitted to [facility] and had a Right THA and was subsequently admitted to inpatient rehab.   He developed urinary retention and hematuria and Urology at [facility] was consulted and placed a foley catheter with difficulty and Urine cultures were also sent, adn he was placed on IV Rocephin.   He was placed on CBI but continued to have hematuria and pass clotts per family.  So his family requested transfer to [facility]  For evaluation adn treatement by his regular Urologist Dr Karsten Ro who has taken care of him for the past 20 years.   The case had been discussed with Dr Jeffie Pollock of [facility] when the arrangements were made for transfer.   After Arrival Urology on-Call was notified for CBI orders and Urology is to see the paitent in the AM.      Review of Systems: Unable to Obtain from the Patient   Past Medical History  Diagnosis Date  . Benign essential HTN   . CAD (coronary artery disease)   . CVA (cerebral infarction)     1990  . BPH (benign prostatic hyperplasia)   . Urinary retention   . Systolic CHF ([place])   . Pleural effusion     Once in past S/P Thoracentesis     Past Surgical History  Procedure Laterality Date  . Total hip arthroplasty Right     09/19/2014  . Appendectomy        Prior to Admission medications   Not on File     Allergies  Allergen Reactions  . Percocet [Oxycodone-Acetaminophen]  Other (See Comments)    Confusion    Social History:  has no tobacco, alcohol, and drug history on file.    Family History  Problem Relation Age of Onset  . CAD Mother   . Diabetes Mother   . Diabetes Father        Physical Exam:  GEN:  Pleasant Drowsy but Arousable  Elderly Well Developed  81 y.o. Caucasian male examined and in no acute distress; cooperative with exam Filed Vitals:   10/16/14 2211  BP: 98/62  Pulse: 93  Temp: 97.9 F (36.6 C)  TempSrc: Oral  Resp: 16  Height: 5\' 10"  (1.778 m)  Weight: 73.3 kg (161 lb 9.6 oz)  SpO2: 100%   Blood pressure 98/62, pulse 93, temperature 97.9 F (36.6 C), temperature source Oral, resp. rate 16, height 5\' 10"  (1.778 m), weight 73.3 kg (161 lb 9.6 oz), SpO2 100 %. PSYCH: He is alert and oriented x4; does not appear anxious does not appear depressed; affect is normal HEENT: Normocephalic and Atraumatic, Mucous membranes pink; PERRLA; EOM intact; Fundi:  Benign;  No scleral icterus, Nares: Patent, Oropharynx: Clear, Edentulous,    Neck:  FROM, No Cervical Lymphadenopathy nor Thyromegaly or Carotid Bruit; No JVD; Breasts:: Not examined CHEST WALL: No tenderness CHEST: Normal respiration, clear to auscultation bilaterally HEART: Regular rate and rhythm; no murmurs  rubs or gallops BACK: No kyphosis or scoliosis; No CVA tenderness ABDOMEN: Positive Bowel Sounds, Soft Non-Tender, No Rebound or Guarding; No Masses, No Organomegaly. Rectal Exam: Not done EXTREMITIES: No Cyanosis, Clubbing, or Edema; No Ulcerations. Genitalia: NMG, Uncircumcized, Foley Cath Present PULSES: 2+ and symmetric SKIN: Normal hydration no rash or ulceration CNS:  Alert and Oriented x 4, No Focal Deficits Vascular: pulses palpable throughout    Labs on Admission:  Basic Metabolic Panel: No results for input(s): NA, K, CL, CO2, GLUCOSE, BUN, CREATININE, CALCIUM, MG, PHOS in the last 168 hours. Liver Function Tests: No results for input(s): AST, ALT,  ALKPHOS, BILITOT, PROT, ALBUMIN in the last 168 hours. No results for input(s): LIPASE, AMYLASE in the last 168 hours. No results for input(s): AMMONIA in the last 168 hours. CBC: No results for input(s): WBC, NEUTROABS, HGB, HCT, MCV, PLT in the last 168 hours. Cardiac Enzymes: No results for input(s): CKTOTAL, CKMB, CKMBINDEX, TROPONINI in the last 168 hours.  BNP (last 3 results) No results for input(s): BNP in the last 8760 hours.  ProBNP (last 3 results) No results for input(s): PROBNP in the last 8760 hours.  CBG: No results for input(s): GLUCAP in the last 168 hours.  Radiological Exams on Admission: No results found.    Assessment/Plan:     81 y.o. male with  Active Problems:   1.    Hematuria/Urinary retention/Urinary tract infection, site not specified   CBI ordered per On call Urology   IV Rocephin  Due to complete 10/03   Resume Flomax   Urology to see this AM     2.    Hypotension   IV Fluid Bolus    Monitor BPs     3.    Benign essential HTN   Hold BP Meds Metoprolol, Spironolactone, Trandolapril and lasix Rx     4.    CAD (coronary artery disease)   Stable     5.    BPH (benign prostatic hyperplasia)   Urology to see this AM     6.    Systolic CHF ([place])   Monitor an Avoid Fluid Overload     7.    DM2-   On Januvia    Hold Januvia Rx   SSI coverage PRN   Check HbA1C      8.   CVA (cerebral infarction)   Hx      9.   DVT Prophylaxis   SCDs    Code Status:     FULL CODE    Family Communication:   Family at Bedside    Disposition Plan:    Inpatient  Status        Time spent: [facility] Pager [phone]   If Travis Costa Please Contact the Day Rounding Team MD for [facility]  If 7PM-7AM, Please Contact Night-Floor Coverage  [email or web address] Password [email or web address] 10/16/2014, 11:50 PM     ADDENDUM:   Patient was seen and examined on 10/16/2014

## 2014-10-16 NOTE — Progress Notes (Addendum)
Patient arrived via ambulance to room 1439,.from [facility]. PCP was notified that the patient had arrived on the floor.   PCP notified the RN that the patient had to be assigned an MD. Flow manager/ admitting at [facility] to be notified.

## 2014-10-17 ENCOUNTER — Encounter (HOSPITAL_COMMUNITY): Payer: Self-pay | Admitting: *Deleted

## 2014-10-17 ENCOUNTER — Inpatient Hospital Stay (HOSPITAL_COMMUNITY): Payer: Medicare Other

## 2014-10-17 LAB — COMPREHENSIVE METABOLIC PANEL
ALBUMIN: 3 g/dL — AB (ref 3.5–5.0)
ALT: 7 U/L — ABNORMAL LOW (ref 17–63)
ANION GAP: 6 (ref 5–15)
AST: 13 U/L — ABNORMAL LOW (ref 15–41)
Alkaline Phosphatase: 137 U/L — ABNORMAL HIGH (ref 38–126)
BILIRUBIN TOTAL: 0.4 mg/dL (ref 0.3–1.2)
BUN: 20 mg/dL (ref 6–20)
CO2: 29 mmol/L (ref 22–32)
Calcium: 8.9 mg/dL (ref 8.9–10.3)
Chloride: 102 mmol/L (ref 101–111)
Creatinine, Ser: 0.96 mg/dL (ref 0.61–1.24)
GFR calc Af Amer: >60 mL/min (ref >60)
Glucose, Bld: 141 mg/dL — ABNORMAL HIGH (ref 65–99)
POTASSIUM: 4.6 mmol/L (ref 3.5–5.1)
Sodium: 137 mmol/L (ref 135–145)
TOTAL PROTEIN: 6.4 g/dL — AB (ref 6.5–8.1)

## 2014-10-17 LAB — CBC
HCT: 27.9 % — ABNORMAL LOW (ref 39.0–52.0)
Hemoglobin: 8.4 g/dL — ABNORMAL LOW (ref 13.0–17.0)
MCH: 25.5 pg — ABNORMAL LOW (ref 26.0–34.0)
MCHC: 30.1 g/dL (ref 30.0–36.0)
MCV: 84.8 fL (ref 78.0–100.0)
PLATELETS: 202 10*3/uL (ref 150–400)
RBC: 3.29 MIL/uL — ABNORMAL LOW (ref 4.22–5.81)
RDW: 15.4 % (ref 11.5–15.5)
WBC: 7 10*3/uL (ref 4.0–10.5)

## 2014-10-17 LAB — BASIC METABOLIC PANEL
Anion gap: 8 (ref 5–15)
BUN: 18 mg/dL (ref 6–20)
CO2: 27 mmol/L (ref 22–32)
CREATININE: 0.93 mg/dL (ref 0.61–1.24)
Calcium: 8.6 mg/dL — ABNORMAL LOW (ref 8.9–10.3)
Chloride: 101 mmol/L (ref 101–111)
GFR calc Af Amer: >60 mL/min (ref >60)
Glucose, Bld: 182 mg/dL — ABNORMAL HIGH (ref 65–99)
Potassium: 4.5 mmol/L (ref 3.5–5.1)
SODIUM: 136 mmol/L (ref 135–145)

## 2014-10-17 LAB — GLUCOSE, CAPILLARY
GLUCOSE-CAPILLARY: 145 mg/dL — AB (ref 65–99)
GLUCOSE-CAPILLARY: 156 mg/dL — AB (ref 65–99)
GLUCOSE-CAPILLARY: 62 mg/dL — AB (ref 65–99)
Glucose-Capillary: 130 mg/dL — ABNORMAL HIGH (ref 65–99)
Glucose-Capillary: 185 mg/dL — ABNORMAL HIGH (ref 65–99)
Glucose-Capillary: 154 mg/dL — ABNORMAL HIGH (ref 65–99)
Glucose-Capillary: 142 mg/dL — ABNORMAL HIGH (ref 65–99)

## 2014-10-17 LAB — IRON AND TIBC
Iron: 42 ug/dL — ABNORMAL LOW (ref 45–182)
Saturation Ratios: 16 % — ABNORMAL LOW (ref 17.9–39.5)
TIBC: 259 ug/dL (ref 250–450)
UIBC: 217 ug/dL

## 2014-10-17 LAB — PROTIME-INR
INR: 1.1 (ref 0.00–1.49)
Prothrombin Time: 14.4 seconds (ref 11.6–15.2)

## 2014-10-17 LAB — FERRITIN: FERRITIN: 288 ng/mL (ref 24–336)

## 2014-10-17 LAB — PSA: PSA: 33.88 ng/mL — AB (ref 0.00–4.00)

## 2014-10-17 MED ORDER — FINASTERIDE 5 MG PO TABS
5.0000 mg | ORAL_TABLET | Freq: Every day | ORAL | Status: DC
  Administered 2014-10-17 – 2014-10-21 (×5): 5 mg via ORAL
  Filled 2014-10-17 (×5): qty 1

## 2014-10-17 MED ORDER — ENSURE ENLIVE PO LIQD
237.0000 mL | Freq: Two times a day (BID) | ORAL | Status: DC
  Administered 2014-10-17: 237 mL via ORAL

## 2014-10-17 MED ORDER — METOPROLOL TARTRATE 25 MG PO TABS
12.5000 mg | ORAL_TABLET | Freq: Two times a day (BID) | ORAL | Status: DC
  Administered 2014-10-17 – 2014-10-21 (×8): 12.5 mg via ORAL
  Filled 2014-10-17 (×8): qty 1

## 2014-10-17 MED ORDER — GLUCERNA SHAKE PO LIQD
237.0000 mL | Freq: Three times a day (TID) | ORAL | Status: DC
  Administered 2014-10-17 – 2014-10-21 (×9): 237 mL via ORAL
  Filled 2014-10-17 (×14): qty 237

## 2014-10-17 MED ORDER — IOHEXOL 300 MG/ML  SOLN
100.0000 mL | Freq: Once | INTRAMUSCULAR | Status: AC | PRN
  Administered 2014-10-17: 100 mL via INTRAVENOUS

## 2014-10-17 MED ORDER — SODIUM CHLORIDE 0.9 % IV SOLN
INTRAVENOUS | Status: DC
  Administered 2014-10-17: 16:00:00 via INTRAVENOUS

## 2014-10-17 MED ORDER — TAMSULOSIN HCL 0.4 MG PO CAPS
0.4000 mg | ORAL_CAPSULE | Freq: Every day | ORAL | Status: DC
  Administered 2014-10-17: 0.4 mg via ORAL
  Filled 2014-10-17: qty 1

## 2014-10-17 NOTE — Progress Notes (Signed)
Initial Nutrition Assessment  DOCUMENTATION CODES:   Not applicable  INTERVENTION:  - Will d/c Ensure Enlive and order Glucerna Shake TID, each supplement provides 220 kcal and 10 grams of protein - RD will continue to monitor for needs  NUTRITION DIAGNOSIS:   Altered nutrition lab value related to chronic illness as evidenced by other (see comment) (CBGs: 62-185 mg/dL).  GOAL:   Patient will meet greater than or equal to 90% of their needs  MONITOR:   PO intake, Supplement acceptance, Weight trends, Labs, I & O's  REASON FOR ASSESSMENT:   Malnutrition Screening Tool  ASSESSMENT:   81 y.o. male with a history of Previous CVA without residual deficits in 1990, Recent NSTEMI and Small CVA and Thoracentesis for a pleural Effusion following a Right THA (on 09/19/2014) after a Mechanical Fall, and history of HTN and BPH who had been admitted to [facility] and had a Right THA and was subsequently admitted to inpatient rehab. He developed urinary retention and hematuria and Urology at [facility] was consulted and placed a foley catheter with difficulty and Urine cultures were also sent, adn he was placed on IV Rocephin. He was placed on CBI but continued to have hematuria and pass clotts per family. So his family requested transfer to [facility] For evaluation adn treatement by his regular Urologist  Pt seen for MST. BMI indicates normal weight status. No intakes documented in chart. Lab in with pt at first attempted visit, tech in to take vitals at time of second attempted visit, and pt being taken out of room at time of third attempted visit; wife was in room but left the room with pt.   Unable to obtain information related to weight trends or appetite/intakes PTA and unable to perform physical assessment at this time. No previous weight hx available in chart. In rounds this AM RN requested that RD change Ensure Enlive to Glucerna Shake related to hx of DM.    Unable to determine if pt is meeting needs at this time or if he meets criteria for malnutrition. Medications reviewed. Labs reviewed; CBGs: 62-185 mg/dL, Ca: 8.5 mg/dL.   Diet Order:  Diet heart healthy/carb modified Room service appropriate?: Yes; Fluid consistency:: Thin  Skin:  Reviewed, no issues  Last BM:  10/1  Height:   Ht Readings from Last 1 Encounters:  10/16/14 5\' 10"  (1.778 m)    Weight:   Wt Readings from Last 1 Encounters:  10/16/14 161 lb 9.6 oz (73.3 kg)    Ideal Body Weight:  75.45 kg (kg)  BMI:  Body mass index is 23.19 kg/(m^2).  Estimated Nutritional Needs:   Kcal:  1450-1650  Protein:  65-75 grams  Fluid:  2.2 L/day  EDUCATION NEEDS:   No education needs identified at this time     [name], RD, LDN Inpatient Clinical Dietitian Pager # [phone] After hours/weekend pager # [phone]

## 2014-10-17 NOTE — Evaluation (Signed)
Physical Therapy Evaluation Patient Details Name: Travis Costa MRN: [id number] DOB: 1924-02-25 Today's Date: 10/17/2014   History of Present Illness  81 y.o. male with a history of previous CVA without residual deficits in 1990, Recent NSTEMI and Small CVA and Thoracentesis for a pleural Effusion following aRight THA(on 09/19/2014) after a Mechanical Fall, and admitted to [facility] for hematuria  Clinical Impression  Pt admitted with above diagnosis. Pt currently with functional limitations due to the deficits listed below (see PT Problem List).  Pt will benefit from skilled PT to increase their independence and safety with mobility to allow discharge to the venue listed below.   Pt assisted OOB to recliner with verbal cues for technique and precautions (due to recent hip surgery).  Spouse reports pt has been in hospital or rehab since R THA and plan is to d/c home.  Pt has RW and w/c as well as ramp to enter home.  Spouse also reports pt having hypotension issues during rehab prior to admission.     Follow Up Recommendations Home health PT;Supervision for mobility/OOB    Equipment Recommendations  None recommended by PT    Recommendations for Other Services       Precautions / Restrictions Precautions Precautions: Fall;Posterior Hip Precaution Comments: hypotension issues after THA (esp during therapy) per spouse Restrictions Weight Bearing Restrictions: No Other Position/Activity Restrictions: spouse reports WBAT      Mobility  Bed Mobility Overal bed mobility: Needs Assistance Bed Mobility: Supine to Sit     Supine to sit: Min guard;HOB elevated     General bed mobility comments: verbal cues for hip precautions and scooting to EOB  Transfers Overall transfer level: Needs assistance Equipment used: Rolling walker (2 wheeled) Transfers: Sit to/from [facility] Sit to Stand: Min assist Stand pivot transfers: Min assist       General transfer comment:  verbal cues for technique within precautions, assist to rise and steady, pt able to take a few steps over to recliner, slighty antalgic  Ambulation/Gait                Stairs            Wheelchair Mobility    Modified Rankin (Stroke Patients Only)       Balance                                             Pertinent Vitals/Pain Pain Assessment: No/denies pain    Home Living Family/patient expects to be discharged to:: Private residence Living Arrangements: Spouse/significant other   Type of Home: House Home Access: Ramped entrance     Home Layout: One level Home Equipment: Environmental consultant - 2 wheels;Wheelchair - manual      Prior Function Level of Independence: Needs assistance   Gait / Transfers Assistance Needed: s/p R THA 09/19/14 and discharged to inpatient rehab, admitted from rehab however plans to d/c home after this admission           Hand Dominance        Extremity/Trunk Assessment               Lower Extremity Assessment: Generalized weakness;RLE deficits/detail RLE Deficits / Details: decreased functional strength observed       Communication   Communication: HOH  Cognition Arousal/Alertness: Awake/alert Behavior During Therapy: WFL for tasks assessed/performed Overall Cognitive Status: Within Functional Limits for  tasks assessed                      General Comments      Exercises        Assessment/Plan    PT Assessment Patient needs continued PT services  PT Diagnosis Difficulty walking;Generalized weakness   PT Problem List Decreased strength;Decreased activity tolerance;Decreased mobility;Decreased knowledge of use of DME;Decreased balance;Decreased knowledge of precautions  PT Treatment Interventions DME instruction;Gait training;Patient/family education;Functional mobility training;Therapeutic activities;Therapeutic exercise;Wheelchair mobility training   PT Goals (Current goals can be found  in the Care Plan section) Acute Rehab PT Goals PT Goal Formulation: With patient/family Time For Goal Achievement: 10/31/14 Potential to Achieve Goals: Good    Frequency Min 3X/week   Barriers to discharge        Co-evaluation               End of Session Equipment Utilized During Treatment: Gait belt Activity Tolerance: Patient tolerated treatment well Patient left: in chair;with call bell/phone within reach;with family/visitor present           Time: 3953-2023 PT Time Calculation (min) (ACUTE ONLY): 22 min   Charges:   PT Evaluation $Initial PT Evaluation Tier I: 1 Procedure     PT G Codes:        Tirth Cothron,KATHrine E 10/17/2014, 10:26 AM Carmelia Bake, PT, DPT 10/17/2014 Pager: [phone]

## 2014-10-17 NOTE — Progress Notes (Signed)
[facility] PROGRESS NOTE  Travis Costa GLO:[id number] DOB: 08/13/1924 DOA: 10/16/2014 PCP: No primary care provider on file.  Assessment/Plan: 81 y/o male with PMH of HTN, BPH, CVA without residual deficits in 1990, recent Right THA(09/19/2014) after a Mechanical Fall, hospitalization complicated with NSTEMI, Small CVA, pneumonia pleural Effusion requiring thoracentesis, chronic BPH who had been admitted to [facility] sent to rehab after Right THA. -He developed urinary retention and hematuria and Urology at [facility] was consulted and placed a foley catheter with difficulty, placed on IV Rocephin for UTI. He was placed on CBI but continued to have hematuria and pass clotts per family. So his family requested transfer to [facility] For evaluation adn treatement by his regular Urologist Dr Karsten Ro who has taken care of him for the past 20 years.  1. Hematuria. Urinary retention. suspected due to bleeding from prostate. On flomax, finasteride. Urology recommended to f/u with voiding trial in 1-2 weeks 2. Acute blood loss anemia. Hematuria. Hg-8.6. We will monitor, repeat Hg in am Tf if <7.5. Check iron panel.  3. Elevated PSA. Urology is following  4. Recent Pneumonia. Pleural effusion. Clinically stable. We will repeat CXR.  5. UTI on IV ceftriaxone. Monitor  6. DM. Pend ha1c. Cont ISS   We will obtain medical records from [facility]  Code Status: full Family Communication: d/w patient, his wife  (indicate person spoken with, relationship, and if by phone, the number) Disposition Plan: home pend clinical improvement    Consultants:  Urology   Procedures:  none  Antibiotics:  Ceftriaxone  (indicate start date, and stop date if known)  HPI/Subjective: Alert. No distress   Objective: Filed Vitals:   10/17/14 1028  BP: 107/73  Pulse: 103  Temp: 97.9 F (36.6 C)  Resp: 20    Intake/Output Summary (Last 24 hours) at 10/17/14  1226 Last data filed at 10/17/14 0913  Gross per 24 hour  Intake 11038.75 ml  Output  16850 ml  Net -5811.25 ml   Filed Weights   10/16/14 2211  Weight: 73.3 kg (161 lb 9.6 oz)    Exam:   General:  Alert   Cardiovascular: s1,s2 rrr  Respiratory: CTA BL  Abdomen: soft, nt,nd   Musculoskeletal: no leg edema   Data Reviewed: Basic Metabolic Panel:  Recent Labs Lab 10/16/14 2358 10/17/14 0456  NA 137 136  K 4.6 4.5  CL 102 101  CO2 29 27  GLUCOSE 141* 182*  BUN 20 18  CREATININE 0.96 0.93  CALCIUM 8.9 8.6*   Liver Function Tests:  Recent Labs Lab 10/16/14 2358  AST 13*  ALT 7*  ALKPHOS 137*  BILITOT 0.4  PROT 6.4*  ALBUMIN 3.0*   No results for input(s): LIPASE, AMYLASE in the last 168 hours. No results for input(s): AMMONIA in the last 168 hours. CBC:  Recent Labs Lab 10/17/14 0456  WBC 7.0  HGB 8.4*  HCT 27.9*  MCV 84.8  PLT 202   Cardiac Enzymes: No results for input(s): CKTOTAL, CKMB, CKMBINDEX, TROPONINI in the last 168 hours. BNP (last 3 results) No results for input(s): BNP in the last 8760 hours.  ProBNP (last 3 results) No results for input(s): PROBNP in the last 8760 hours.  CBG:  Recent Labs Lab 10/17/14 0039 10/17/14 0106 10/17/14 0356 10/17/14 0743 10/17/14 1152  GLUCAP 62* 130* 185* 145* 154*    No results found for this or any previous visit (from the past 240 hour(s)).   Studies: No results  found.  Scheduled Meds: . feeding supplement (ENSURE ENLIVE)  237 mL Oral BID BM  . finasteride  5 mg Oral Daily  . insulin aspart  0-9 Units Subcutaneous 6 times per day  . sodium chloride  3 mL Intravenous Q12H  . tamsulosin  0.4 mg Oral Daily   Continuous Infusions:   Active Problems:   Hematuria   Urinary retention   Urinary tract infection, site not specified   Hypotension   Benign essential HTN   CAD (coronary artery disease)   BPH (benign prostatic hyperplasia)   Systolic CHF (HCC)   CVA (cerebral  infarction)    Time spent: >35 minutes     Kinnie Feil  [facility] Pager [phone]. If 7PM-7AM, please contact night-coverage at [email or web address], password [facility] 10/17/2014, 12:26 PM  LOS: 1 day

## 2014-10-18 LAB — CBC
HEMATOCRIT: 27 % — AB (ref 39.0–52.0)
HEMOGLOBIN: 8 g/dL — AB (ref 13.0–17.0)
MCH: 25.2 pg — AB (ref 26.0–34.0)
MCHC: 29.6 g/dL — ABNORMAL LOW (ref 30.0–36.0)
MCV: 84.9 fL (ref 78.0–100.0)
Platelets: 205 10*3/uL (ref 150–400)
RBC: 3.18 MIL/uL — AB (ref 4.22–5.81)
RDW: 15.5 % (ref 11.5–15.5)
WBC: 5.5 10*3/uL (ref 4.0–10.5)

## 2014-10-18 LAB — HEMOGLOBIN A1C
Hgb A1c MFr Bld: 6.7 % — ABNORMAL HIGH (ref 4.8–5.6)
MEAN PLASMA GLUCOSE: 146 mg/dL

## 2014-10-18 LAB — GLUCOSE, CAPILLARY
GLUCOSE-CAPILLARY: 105 mg/dL — AB (ref 65–99)
GLUCOSE-CAPILLARY: 115 mg/dL — AB (ref 65–99)
GLUCOSE-CAPILLARY: 127 mg/dL — AB (ref 65–99)
GLUCOSE-CAPILLARY: 155 mg/dL — AB (ref 65–99)
Glucose-Capillary: 139 mg/dL — ABNORMAL HIGH (ref 65–99)
Glucose-Capillary: 121 mg/dL — ABNORMAL HIGH (ref 65–99)

## 2014-10-18 MED ORDER — FERROUS SULFATE 325 (65 FE) MG PO TABS
325.0000 mg | ORAL_TABLET | Freq: Three times a day (TID) | ORAL | Status: DC
  Administered 2014-10-18 – 2014-10-21 (×10): 325 mg via ORAL
  Filled 2014-10-18 (×10): qty 1

## 2014-10-18 MED ORDER — TAMSULOSIN HCL 0.4 MG PO CAPS
0.8000 mg | ORAL_CAPSULE | Freq: Every day | ORAL | Status: DC
  Administered 2014-10-18 – 2014-10-21 (×4): 0.8 mg via ORAL
  Filled 2014-10-18 (×4): qty 2

## 2014-10-18 MED ORDER — TRAZODONE HCL 50 MG PO TABS
50.0000 mg | ORAL_TABLET | Freq: Every day | ORAL | Status: DC
  Administered 2014-10-18: 50 mg via ORAL
  Filled 2014-10-18: qty 1

## 2014-10-18 MED ORDER — DIPHENHYDRAMINE HCL 12.5 MG/5ML PO ELIX
12.5000 mg | ORAL_SOLUTION | Freq: Once | ORAL | Status: AC
  Administered 2014-10-18: 12.5 mg via ORAL
  Filled 2014-10-18: qty 5

## 2014-10-18 NOTE — Progress Notes (Signed)
Subjective: Patient reports that he was restless last night. No suprapubic pain and tolerating the catheter.  Objective: Vital signs in last 24 hours: Temp:  [97.9 F (36.6 C)-99.8 F (37.7 C)] 97.9 F (36.6 C) (10/04 0500) Pulse Rate:  [95-120] 95 (10/04 0500) Resp:  [20] 20 (10/04 0500) BP: (96-109)/(59-74) 109/59 mmHg (10/04 0500) SpO2:  [97 %-100 %] 98 % (10/04 0500) Weight:  [73.4 kg (161 lb 13.1 oz)] 73.4 kg (161 lb 13.1 oz) (10/04 0500)  Intake/Output from previous day: 10/03 0701 - 10/04 0700 In: 5750 [P.O.:600; I.V.:350] Out: 8525 [Urine:8525] Intake/Output this shift: Total I/O In: 250 [I.V.:250] Out: 3475 [Urine:3475]  Physical Exam:  His abdomen is soft and nontender. His catheter is draining and is on very slow CBI and just slightly pink with no clots.  Lab Results:  Recent Labs  10/17/14 0456 10/18/14 0502  HGB 8.4* 8.0*  HCT 27.9* 27.0*   BMET  Recent Labs  10/16/14 2358 10/17/14 0456  NA 137 136  K 4.6 4.5  CL 102 101  CO2 29 27  GLUCOSE 141* 182*  BUN 20 18  CREATININE 0.96 0.93  CALCIUM 8.9 8.6*    Recent Labs  10/16/14 2358  INR 1.10   No results for input(s): LABURIN in the last 72 hours. No results found for this or any previous visit.  Studies/Results: Ct Abdomen Pelvis W Wo Contrast  10/17/2014   CLINICAL DATA:  Initial encounter for gross hematuria  EXAM: CT ABDOMEN AND PELVIS WITHOUT AND WITH CONTRAST  TECHNIQUE: Multidetector CT imaging of the abdomen and pelvis was performed following the standard protocol before and following the bolus administration of intravenous contrast.  CONTRAST:  175mL OMNIPAQUE IOHEXOL 300 MG/ML  SOLN  COMPARISON:  None.  FINDINGS: Lower chest: Dependent atelectasis noted in the lower lobes with small bilateral pleural effusions.  Hepatobiliary: No focal abnormality in the liver on this study without intravenous contrast. No evidence of hepatomegaly. Gallbladder is distended without evidence of  stones. No intrahepatic or extrahepatic biliary dilation.  Pancreas: Pancreas is diffusely atrophic without dilatation of the main duct.  Spleen: No splenomegaly. No focal mass lesion.  Adrenals/Urinary Tract: No adrenal nodule or mass.  Pre contrast imaging shows no stones in the right kidney. Despite the relative lack of secondary changes in the right kidney and ureter, a 5 x 5 x 7 mm stone is identified in the distal right ureter approximately 1-2 cm proximal to the UVJ. Several small cysts are seen in the right kidney. 5 mm hyper attenuating lesion (image 58 series 2) is too small to definitively characterize but may be a cyst complicated by proteinaceous debris or hemorrhage. Delayed imaging shows no abnormality in the right intrarenal collecting system or renal pelvis. Right ureter is well opacified down to the level of the stone and is unremarkable.  2 mm nonobstructing stone is identified in the interpolar left kidney. No evidence for left ureteral stones. Delayed imaging shows no abnormality of the intrarenal collecting system or renal pelvis. The left ureter is not well opacified but shows no evidence for focal hydroureter.  A large irregular filling defect in the bladder lumen suggest the presence of a blood clot. A urinary catheter is visualized in situ. Gas within the bladder is presumably secondary to the instrumentation.  Stomach/Bowel: Stomach is nondistended. No gastric wall thickening. No evidence of outlet obstruction. Duodenum is normally positioned as is the ligament of Treitz. No small bowel wall thickening. No small bowel dilatation. The  terminal ileum is normal. The appendix is not visualized, but there is no edema or inflammation in the region of the cecum. Diverticular changes are noted in the left colon without evidence of diverticulitis.  Vascular/Lymphatic: There is abdominal aortic atherosclerosis without aneurysm. There is no gastrohepatic or hepatoduodenal ligament lymphadenopathy. No  intraperitoneal or retroperitoneal lymphadenopy. No pelvic sidewall lymphadenopathy.  Reproductive: Prostate gland is markedly enlarged and heterogeneous.  Other: No intraperitoneal free fluid.  Musculoskeletal: Patient is status post ORIF for right femoral neck fracture. Bones are diffusely demineralized. Bone windows reveal no worrisome lytic or sclerotic osseous lesions.  IMPRESSION: 1. 5 x 5 x 7 mm stone in the distal right ureter approximately 10-20 mm above the UVJ. No substantial secondary change in the right kidney or ureter. 2. 2 mm nonobstructing stone in the interpolar left kidney. 3. No evidence for enhancing renal mass. There is a 5 mm hyper attenuating lesion in the medial left kidney which may be a complex cyst, but is too small to definitively characterize. 4. Large irregular filling defect in the bladder lumen most consistent with clot. This could obscure small mucosal lesions. 5. Marked prostatomegaly.   Electronically Signed   By: Misty Stanley M.D.   On: 10/17/2014 16:24   Dg Chest 2 View  10/17/2014   CLINICAL DATA:  Cough and weakness, history of CHF, pleural effusions, hematuria.  EXAM: CHEST  2 VIEW  COMPARISON:  None in PACs  FINDINGS: The lungs are adequately inflated. Mild elevation of the left hemidiaphragm is noted but may be positional. The heart and pulmonary vascularity are normal. The pulmonary interstitial markings are coarse. The pulmonary vascularity is not engorged. There is a small pleural effusion on the right layering posteriorly. The bony thorax exhibits no acute abnormality.  IMPRESSION: Small right pleural effusion. No definite evidence of pneumonia nor pulmonary edema.   Electronically Signed   By: David  Martinique M.D.   On: 10/17/2014 14:55    Assessment/Plan: He continues to have very mild hematuria. I irrigated the bladder today and noted no clots. He's not on any antiplatelets medication or anticoagulates at this time. I'm going to try stopping his CBI and see how  he does but leaving it hooked up in case it needs to be irrigated and restarted. We discussed the fact that I would like to proceed conservatively since he is at a higher surgical risk due to his age, recent CVA and recent MI. He and his wife are in agreement with this.  Trial of stopping CBI  Increase tamsulosin 0.8 mg in preparation for catheter removal  Maintain Foley catheter for now and restart CBI if necessary.   LOS: 2 days   Kateena Degroote C 10/18/2014, 6:55 AM

## 2014-10-18 NOTE — Care Management Important Message (Signed)
Important Message  Patient Details  Name: Maddyx Wieck MRN: [id number] Date of Birth: 1924-04-07   Medicare Important Message Given:  Yes-second notification given    Camillo Flaming 10/18/2014, 12:35 [place] Message  Patient Details  Name: Jousha Schwandt MRN: [id number] Date of Birth: Oct 18, 1924   Medicare Important Message Given:  Yes-second notification given    Camillo Flaming 10/18/2014, 12:35 PM

## 2014-10-18 NOTE — Progress Notes (Signed)
Physical Therapy Treatment Patient Details Name: Travis Costa MRN: [id number] DOB: 1924/07/23 Today's Date: 10/18/2014    History of Present Illness 81 y.o. male with a history of previous CVA without residual deficits in 1990, Recent NSTEMI and Small CVA and Thoracentesis for a pleural Effusion following aRight THA(on 09/19/2014) after a Mechanical Fall, and admitted to [facility] for hematuria    PT Comments    Pt reports not getting much sleep last night however agreeable to perform a few exercises and mobilize.  Follow Up Recommendations  Home health PT;Supervision for mobility/OOB     Equipment Recommendations  None recommended by PT    Recommendations for Other Services       Precautions / Restrictions Precautions Precautions: Fall;Posterior Hip Precaution Comments: hypotension issues after THA (esp during therapy) per spouse Restrictions Other Position/Activity Restrictions: spouse reports WBAT    Mobility  Bed Mobility Overal bed mobility: Needs Assistance Bed Mobility: Supine to Sit     Supine to sit: HOB elevated;Supervision        Transfers Overall transfer level: Needs assistance Equipment used: Rolling walker (2 wheeled) Transfers: Sit to/from Stand Sit to Stand: Min assist         General transfer comment: verbal cues for technique within precautions, assist to rise and steady as well as control descent  Ambulation/Gait Ambulation/Gait assistance: Min guard Ambulation Distance (Feet): 4 Feet Assistive device: Rolling walker (2 wheeled) Gait Pattern/deviations: Step-to pattern;Antalgic     General Gait Details: pt able to take a few steps from bed over to recliner, cues to turn toward unaffected LE, pt denies dizziness today   Stairs            Wheelchair Mobility    Modified Rankin (Stroke Patients Only)       Balance                                    Cognition Arousal/Alertness: Awake/alert Behavior During  Therapy: WFL for tasks assessed/performed Overall Cognitive Status: Within Functional Limits for tasks assessed                      Exercises General Exercises - Lower Extremity Ankle Circles/Pumps: AROM;Both;10 reps Quad Sets: AROM;Both;10 reps Long Arc Quad: AROM;Both;Supine;Seated;10 reps Heel Slides: AROM;Right;10 reps (within precautions) Hip ABduction/ADduction: AROM;Right;10 reps Straight Leg Raises: AROM;Both;10 reps (within precautions)    General Comments        Pertinent Vitals/Pain Pain Assessment: No/denies pain    Home Living                      Prior Function            PT Goals (current goals can now be found in the care plan section) Progress towards PT goals: Progressing toward goals    Frequency  Min 3X/week    PT Plan Current plan remains appropriate    Co-evaluation             End of Session Equipment Utilized During Treatment: Gait belt Activity Tolerance: Patient tolerated treatment well Patient left: in chair;with call bell/phone within reach;with family/visitor present     Time: 1056-1110 PT Time Calculation (min) (ACUTE ONLY): 14 min  Charges:  $Therapeutic Activity: 8-22 mins                    G Codes:  Dinorah Masullo,KATHrine E 10/18/2014, 1:00 PM Carmelia Bake, PT, DPT 10/18/2014 Pager: [phone]

## 2014-10-18 NOTE — Progress Notes (Signed)
[facility] PROGRESS NOTE  Travis Costa MWN:[id number] DOB: 03/02/1924 DOA: 10/16/2014 PCP: No primary care provider on file.  Assessment/Plan: 81 y/o male with PMH of HTN, BPH, CVA without residual deficits in 1990, recent Right THA(09/19/2014) after a Mechanical Fall, hospitalization complicated with NSTEMI, Small CVA, pneumonia pleural Effusion requiring thoracentesis, chronic BPH who had been admitted to [facility] sent to rehab after Right THA. -He developed urinary retention and hematuria and Urology at [facility] was consulted and placed a foley catheter with difficulty, placed on IV Rocephin for UTI. He was placed on CBI but continued to have hematuria and pass clotts per family. So his family requested transfer to [facility] For evaluation adn treatement by his regular Urologist Dr Karsten Ro who has taken care of him for the past 20 years.  1. Hematuria-still ongoing. Urinary retention. suspected due to bleeding from prostate. Also CT abd: nephrolithiasis.  showed On flomax, finasteride. On CBI. Urology is following.  2. Acute blood loss anemia. Hematuria. Hg-8.6->8.0. D/w patient, his wife. They preferred to monitor, repeat Hg in am Tf if <7.5. Start Iron supplements   3. Elevated PSA. Urology is following  4. Recent Pneumonia. Pleural effusion. Clinically stable. Repeat CXR: no clear infiltrates  5. UTI. Apparently  patient was started on IV ceftriaxone at [facility]. He received ceftriaxone for 2 more day here at [facility]. He remains afebrile. No leukocytosis. clinically stable. Monitor  6. DM. ha1c-6.7. Cont ISS   Still awaiting medical records from [place] Status: full Family Communication: d/w patient, his wife  (indicate person spoken with, relationship, and if by phone, the number) Disposition Plan: home pend clinical improvement    Consultants:  Urology   Procedures:  none  Antibiotics:  Ceftriaxone  (indicate  start date, and stop date if known)  HPI/Subjective: Alert. No distress   Objective: Filed Vitals:   10/18/14 0949  BP: 107/61  Pulse: 101  Temp:   Resp: 18    Intake/Output Summary (Last 24 hours) at 10/18/14 1126 Last data filed at 10/18/14 0700  Gross per 24 hour  Intake  11610 ml  Output  10325 ml  Net   1285 ml   Filed Weights   10/16/14 2211 10/18/14 0500  Weight: 73.3 kg (161 lb 9.6 oz) 73.4 kg (161 lb 13.1 oz)    Exam:   General:  Alert   Cardiovascular: s1,s2 rrr  Respiratory: CTA BL  Abdomen: soft, nt,nd   Musculoskeletal: no leg edema   Data Reviewed: Basic Metabolic Panel:  Recent Labs Lab 10/16/14 2358 10/17/14 0456  NA 137 136  K 4.6 4.5  CL 102 101  CO2 29 27  GLUCOSE 141* 182*  BUN 20 18  CREATININE 0.96 0.93  CALCIUM 8.9 8.6*   Liver Function Tests:  Recent Labs Lab 10/16/14 2358  AST 13*  ALT 7*  ALKPHOS 137*  BILITOT 0.4  PROT 6.4*  ALBUMIN 3.0*   No results for input(s): LIPASE, AMYLASE in the last 168 hours. No results for input(s): AMMONIA in the last 168 hours. CBC:  Recent Labs Lab 10/17/14 0456 10/18/14 0502  WBC 7.0 5.5  HGB 8.4* 8.0*  HCT 27.9* 27.0*  MCV 84.8 84.9  PLT 202 205   Cardiac Enzymes: No results for input(s): CKTOTAL, CKMB, CKMBINDEX, TROPONINI in the last 168 hours. BNP (last 3 results) No results for input(s): BNP in the last 8760 hours.  ProBNP (last 3 results) No results for input(s): PROBNP in the  last 8760 hours.  CBG:  Recent Labs Lab 10/17/14 1610 10/17/14 2031 10/17/14 2346 10/18/14 0500 10/18/14 0737  GLUCAP 156* 142* 127* 139* 121*    No results found for this or any previous visit (from the past 240 hour(s)).   Studies: Ct Abdomen Pelvis W Wo Contrast  10/17/2014   CLINICAL DATA:  Initial encounter for gross hematuria  EXAM: CT ABDOMEN AND PELVIS WITHOUT AND WITH CONTRAST  TECHNIQUE: Multidetector CT imaging of the abdomen and pelvis was performed following the  standard protocol before and following the bolus administration of intravenous contrast.  CONTRAST:  144mL OMNIPAQUE IOHEXOL 300 MG/ML  SOLN  COMPARISON:  None.  FINDINGS: Lower chest: Dependent atelectasis noted in the lower lobes with small bilateral pleural effusions.  Hepatobiliary: No focal abnormality in the liver on this study without intravenous contrast. No evidence of hepatomegaly. Gallbladder is distended without evidence of stones. No intrahepatic or extrahepatic biliary dilation.  Pancreas: Pancreas is diffusely atrophic without dilatation of the main duct.  Spleen: No splenomegaly. No focal mass lesion.  Adrenals/Urinary Tract: No adrenal nodule or mass.  Pre contrast imaging shows no stones in the right kidney. Despite the relative lack of secondary changes in the right kidney and ureter, a 5 x 5 x 7 mm stone is identified in the distal right ureter approximately 1-2 cm proximal to the UVJ. Several small cysts are seen in the right kidney. 5 mm hyper attenuating lesion (image 58 series 2) is too small to definitively characterize but may be a cyst complicated by proteinaceous debris or hemorrhage. Delayed imaging shows no abnormality in the right intrarenal collecting system or renal pelvis. Right ureter is well opacified down to the level of the stone and is unremarkable.  2 mm nonobstructing stone is identified in the interpolar left kidney. No evidence for left ureteral stones. Delayed imaging shows no abnormality of the intrarenal collecting system or renal pelvis. The left ureter is not well opacified but shows no evidence for focal hydroureter.  A large irregular filling defect in the bladder lumen suggest the presence of a blood clot. A urinary catheter is visualized in situ. Gas within the bladder is presumably secondary to the instrumentation.  Stomach/Bowel: Stomach is nondistended. No gastric wall thickening. No evidence of outlet obstruction. Duodenum is normally positioned as is the  ligament of Treitz. No small bowel wall thickening. No small bowel dilatation. The terminal ileum is normal. The appendix is not visualized, but there is no edema or inflammation in the region of the cecum. Diverticular changes are noted in the left colon without evidence of diverticulitis.  Vascular/Lymphatic: There is abdominal aortic atherosclerosis without aneurysm. There is no gastrohepatic or hepatoduodenal ligament lymphadenopathy. No intraperitoneal or retroperitoneal lymphadenopy. No pelvic sidewall lymphadenopathy.  Reproductive: Prostate gland is markedly enlarged and heterogeneous.  Other: No intraperitoneal free fluid.  Musculoskeletal: Patient is status post ORIF for right femoral neck fracture. Bones are diffusely demineralized. Bone windows reveal no worrisome lytic or sclerotic osseous lesions.  IMPRESSION: 1. 5 x 5 x 7 mm stone in the distal right ureter approximately 10-20 mm above the UVJ. No substantial secondary change in the right kidney or ureter. 2. 2 mm nonobstructing stone in the interpolar left kidney. 3. No evidence for enhancing renal mass. There is a 5 mm hyper attenuating lesion in the medial left kidney which may be a complex cyst, but is too small to definitively characterize. 4. Large irregular filling defect in the bladder lumen most consistent with  clot. This could obscure small mucosal lesions. 5. Marked prostatomegaly.   Electronically Signed   By: Misty Stanley M.D.   On: 10/17/2014 16:24   Dg Chest 2 View  10/17/2014   CLINICAL DATA:  Cough and weakness, history of CHF, pleural effusions, hematuria.  EXAM: CHEST  2 VIEW  COMPARISON:  None in PACs  FINDINGS: The lungs are adequately inflated. Mild elevation of the left hemidiaphragm is noted but may be positional. The heart and pulmonary vascularity are normal. The pulmonary interstitial markings are coarse. The pulmonary vascularity is not engorged. There is a small pleural effusion on the right layering posteriorly. The  bony thorax exhibits no acute abnormality.  IMPRESSION: Small right pleural effusion. No definite evidence of pneumonia nor pulmonary edema.   Electronically Signed   By: David  Martinique M.D.   On: 10/17/2014 14:55    Scheduled Meds: . feeding supplement (GLUCERNA SHAKE)  237 mL Oral TID BM  . finasteride  5 mg Oral Daily  . insulin aspart  0-9 Units Subcutaneous 6 times per day  . metoprolol tartrate  12.5 mg Oral BID  . sodium chloride  3 mL Intravenous Q12H  . tamsulosin  0.8 mg Oral Daily   Continuous Infusions: . sodium chloride 50 mL/hr at 10/17/14 1600    Active Problems:   Hematuria   Urinary retention   Urinary tract infection, site not specified   Hypotension   Benign essential HTN   CAD (coronary artery disease)   BPH (benign prostatic hyperplasia)   Systolic CHF ([place])   CVA (cerebral infarction)    Time spent: >35 minutes     Kinnie Feil  [facility] Pager [phone]. If 7PM-7AM, please contact night-coverage at [email or web address], password [facility] 10/18/2014, 11:26 AM  LOS: 2 days

## 2014-10-18 NOTE — Progress Notes (Signed)
CBI clamped, irrigated foley at noon due to foley draining bloody urine, no clots retrieved. Continue to leave CBI clamped. Will continue to monitor patient.   1600 -Irrigated foley due to dark red urine draining, small clots noted. Restarted CBI. Will continue to monitor pt.

## 2014-10-19 DIAGNOSIS — N400 Enlarged prostate without lower urinary tract symptoms: Secondary | ICD-10-CM

## 2014-10-19 DIAGNOSIS — R319 Hematuria, unspecified: Secondary | ICD-10-CM

## 2014-10-19 DIAGNOSIS — R339 Retention of urine, unspecified: Secondary | ICD-10-CM

## 2014-10-19 DIAGNOSIS — E1121 Type 2 diabetes mellitus with diabetic nephropathy: Secondary | ICD-10-CM

## 2014-10-19 DIAGNOSIS — N390 Urinary tract infection, site not specified: Secondary | ICD-10-CM

## 2014-10-19 DIAGNOSIS — I10 Essential (primary) hypertension: Secondary | ICD-10-CM

## 2014-10-19 LAB — GLUCOSE, CAPILLARY
GLUCOSE-CAPILLARY: 117 mg/dL — AB (ref 65–99)
GLUCOSE-CAPILLARY: 121 mg/dL — AB (ref 65–99)
GLUCOSE-CAPILLARY: 157 mg/dL — AB (ref 65–99)
Glucose-Capillary: 136 mg/dL — ABNORMAL HIGH (ref 65–99)
Glucose-Capillary: 167 mg/dL — ABNORMAL HIGH (ref 65–99)

## 2014-10-19 LAB — CBC
HCT: 29.6 % — ABNORMAL LOW (ref 39.0–52.0)
HEMOGLOBIN: 9 g/dL — AB (ref 13.0–17.0)
MCH: 26.1 pg (ref 26.0–34.0)
MCHC: 30.4 g/dL (ref 30.0–36.0)
MCV: 85.8 fL (ref 78.0–100.0)
Platelets: 204 10*3/uL (ref 150–400)
RBC: 3.45 MIL/uL — ABNORMAL LOW (ref 4.22–5.81)
RDW: 15.8 % — AB (ref 11.5–15.5)
WBC: 5.9 10*3/uL (ref 4.0–10.5)

## 2014-10-19 NOTE — Progress Notes (Signed)
Subjective: Patient reports No complaints and specifically no suprapubic discomfort or other difficulties with his catheter.  Objective: Vital signs in last 24 hours: Temp:  [97.5 F (36.4 C)-98.6 F (37 C)] 98 F (36.7 C) (10/05 0453) Pulse Rate:  [93-102] 102 (10/05 0453) Resp:  [18] 18 (10/05 0453) BP: (98-114)/(57-65) 114/65 mmHg (10/05 0453) SpO2:  [95 %-100 %] 95 % (10/05 0453) Weight:  [70.8 kg (156 lb 1.4 oz)] 70.8 kg (156 lb 1.4 oz) (10/05 0453)  Intake/Output from previous day: 10/04 0701 - 10/05 0700 In: 2900 [P.O.:240] Out: 5250 [Urine:5250] Intake/Output this shift:    Physical Exam:  His abdomen remained soft and nontender with no suprapubic mass. His Foley catheter is in place and draining blood-tinged urine.  Lab Results:  Recent Labs  10/17/14 0456 10/18/14 0502 10/19/14 0513  HGB 8.4* 8.0* 9.0*  HCT 27.9* 27.0* 29.6*   BMET  Recent Labs  10/16/14 2358 10/17/14 0456  NA 137 136  K 4.6 4.5  CL 102 101  CO2 29 27  GLUCOSE 141* 182*  BUN 20 18  CREATININE 0.96 0.93  CALCIUM 8.9 8.6*    Recent Labs  10/16/14 2358  INR 1.10   No results for input(s): LABURIN in the last 72 hours. No results found for this or any previous visit.  Studies/Results: Ct Abdomen Pelvis W Wo Contrast  10/17/2014   CLINICAL DATA:  Initial encounter for gross hematuria  EXAM: CT ABDOMEN AND PELVIS WITHOUT AND WITH CONTRAST  TECHNIQUE: Multidetector CT imaging of the abdomen and pelvis was performed following the standard protocol before and following the bolus administration of intravenous contrast.  CONTRAST:  120mL OMNIPAQUE IOHEXOL 300 MG/ML  SOLN  COMPARISON:  None.  FINDINGS: Lower chest: Dependent atelectasis noted in the lower lobes with small bilateral pleural effusions.  Hepatobiliary: No focal abnormality in the liver on this study without intravenous contrast. No evidence of hepatomegaly. Gallbladder is distended without evidence of stones. No  intrahepatic or extrahepatic biliary dilation.  Pancreas: Pancreas is diffusely atrophic without dilatation of the main duct.  Spleen: No splenomegaly. No focal mass lesion.  Adrenals/Urinary Tract: No adrenal nodule or mass.  Pre contrast imaging shows no stones in the right kidney. Despite the relative lack of secondary changes in the right kidney and ureter, a 5 x 5 x 7 mm stone is identified in the distal right ureter approximately 1-2 cm proximal to the UVJ. Several small cysts are seen in the right kidney. 5 mm hyper attenuating lesion (image 58 series 2) is too small to definitively characterize but may be a cyst complicated by proteinaceous debris or hemorrhage. Delayed imaging shows no abnormality in the right intrarenal collecting system or renal pelvis. Right ureter is well opacified down to the level of the stone and is unremarkable.  2 mm nonobstructing stone is identified in the interpolar left kidney. No evidence for left ureteral stones. Delayed imaging shows no abnormality of the intrarenal collecting system or renal pelvis. The left ureter is not well opacified but shows no evidence for focal hydroureter.  A large irregular filling defect in the bladder lumen suggest the presence of a blood clot. A urinary catheter is visualized in situ. Gas within the bladder is presumably secondary to the instrumentation.  Stomach/Bowel: Stomach is nondistended. No gastric wall thickening. No evidence of outlet obstruction. Duodenum is normally positioned as is the ligament of Treitz. No small bowel wall thickening. No small bowel dilatation. The terminal ileum is normal. The appendix  is not visualized, but there is no edema or inflammation in the region of the cecum. Diverticular changes are noted in the left colon without evidence of diverticulitis.  Vascular/Lymphatic: There is abdominal aortic atherosclerosis without aneurysm. There is no gastrohepatic or hepatoduodenal ligament lymphadenopathy. No  intraperitoneal or retroperitoneal lymphadenopy. No pelvic sidewall lymphadenopathy.  Reproductive: Prostate gland is markedly enlarged and heterogeneous.  Other: No intraperitoneal free fluid.  Musculoskeletal: Patient is status post ORIF for right femoral neck fracture. Bones are diffusely demineralized. Bone windows reveal no worrisome lytic or sclerotic osseous lesions.  IMPRESSION: 1. 5 x 5 x 7 mm stone in the distal right ureter approximately 10-20 mm above the UVJ. No substantial secondary change in the right kidney or ureter. 2. 2 mm nonobstructing stone in the interpolar left kidney. 3. No evidence for enhancing renal mass. There is a 5 mm hyper attenuating lesion in the medial left kidney which may be a complex cyst, but is too small to definitively characterize. 4. Large irregular filling defect in the bladder lumen most consistent with clot. This could obscure small mucosal lesions. 5. Marked prostatomegaly.   Electronically Signed   By: Misty Stanley M.D.   On: 10/17/2014 16:24   Dg Chest 2 View  10/17/2014   CLINICAL DATA:  Cough and weakness, history of CHF, pleural effusions, hematuria.  EXAM: CHEST  2 VIEW  COMPARISON:  None in PACs  FINDINGS: The lungs are adequately inflated. Mild elevation of the left hemidiaphragm is noted but may be positional. The heart and pulmonary vascularity are normal. The pulmonary interstitial markings are coarse. The pulmonary vascularity is not engorged. There is a small pleural effusion on the right layering posteriorly. The bony thorax exhibits no acute abnormality.  IMPRESSION: Small right pleural effusion. No definite evidence of pneumonia nor pulmonary edema.   Electronically Signed   By: David  Martinique M.D.   On: 10/17/2014 14:55    Assessment/Plan: Gross hematuria: His gross hematuria has persisted.  It is almost certainly from his massive prostate.  We have discussed proceeding with a very conservative course as he is at a much higher risk general  surgery. He did well yesterday for a while with his CBI off but then developed further hematuria and some cough so it was restarted.  The  urine this morning remains bloody but is not marked hematuria but rather persistent low-grade that is continuing to require the use of continuous bladder irrigation.  It appeared that his hemoglobin may have been decreasing however when it was checked this morning it had actually increased and was found to be 9.   I placed his catheter on mild traction today.  Continue CBI for now.   LOS: 3 days   Khamille Beynon C 10/19/2014, 8:38 AM

## 2014-10-19 NOTE — Progress Notes (Signed)
Patient continues to have dark red blood output  through the foley catheter,  CBI continued as patient has  marked hematuria, and evident of small clots.  Irving Copas, Therapist, sports

## 2014-10-19 NOTE — Progress Notes (Signed)
Spoke with pt's wife at bedside concerning discharge disposition. Wife selected [facility] for [facility].  [place] was called [phone], faxed [phone].

## 2014-10-19 NOTE — Progress Notes (Signed)
[facility] PROGRESS NOTE  Marques Ericson ELF:[id number] DOB: 06-Sep-1924 DOA: 10/16/2014 PCP: No primary care provider on file.  Assessment/Plan: 81 y/o male with PMH of HTN, BPH, CVA without residual deficits in 1990, recent Right THA(09/19/2014) after a Mechanical Fall, hospitalization complicated with NSTEMI, Small CVA, pneumonia pleural Effusion requiring thoracentesis, chronic BPH who had been admitted to [facility] sent to rehab after Right THA. -He developed urinary retention and hematuria and Urology at [facility] was consulted and placed a foley catheter with difficulty, placed on IV Rocephin for UTI. He was placed on CBI but continued to have hematuria and pass clotts per family. So his family requested transfer to [facility] For evaluation adn treatement by his regular Urologist Dr Karsten Ro who has taken care of him for the past 20 years.  1. Hematuria-still ongoing. Urinary retention. suspected due to bleeding from prostate. Also CT abd demonstrated none-obstructive nephrolithiasis.  Will continue flomax (dose increased to 0.8 mg), finasteride. On CBI. Urology is following.  2. Acute blood loss anemia. Hematuria. Hg-8.6->8.0>>9.0. Will monitor. No need for transfusion at this moment. Will Tf if <7.5. Continue Iron supplements   3. Elevated PSA. Urology is following; will follow rec's 4. Recent Pneumonia. Pleural effusion. Clinically stable. Repeat CXR: no clear infiltrates  5. UTI. Apparently  patient was started on IV ceftriaxone at [facility]. He received ceftriaxone for 2 more day here at [facility]. He remains afebrile. No leukocytosis. clinically stable. Will Monitor off antibiotics 6. DM. ha1c-6.7. Cont SSI and modified carb diet. CBG's stable 7.physical deconditioning: will arrange home health therapy at discharge as recommended by PT  Code Status: full code Family Communication: d/w patient, his wife at bedside Disposition Plan: home with home health  services pend clinical improvement    Consultants:  Urology   Procedures:  On Continuous Bladder Irrigation   Antibiotics:  Ceftriaxone  Stopped on 10/04  HPI/Subjective: No fever, no abd pain, no CP, no SOB> patient with good urine output and despite ongoing hematuria, he has improvement in his Hgb trend and overall urine is more clear.  Objective: Filed Vitals:   10/19/14 1354  BP: 103/55  Pulse: 82  Temp: 98.5 F (36.9 C)  Resp: 20    Intake/Output Summary (Last 24 hours) at 10/19/14 1852 Last data filed at 10/19/14 1741  Gross per 24 hour  Intake   5660 ml  Output  10050 ml  Net  -4390 ml   Filed Weights   10/16/14 2211 10/18/14 0500 10/19/14 0453  Weight: 73.3 kg (161 lb 9.6 oz) 73.4 kg (161 lb 13.1 oz) 70.8 kg (156 lb 1.4 oz)    Exam:   General:  Alert, awake and oriented X 3, no fever, no CP and denies SOB. Still with hematuria, but improving.  Cardiovascular: s1,s2 rrr  Respiratory: CTA BL  Abdomen: soft, nt,nd   Musculoskeletal: no leg edema   Data Reviewed: Basic Metabolic Panel:  Recent Labs Lab 10/16/14 2358 10/17/14 0456  NA 137 136  K 4.6 4.5  CL 102 101  CO2 29 27  GLUCOSE 141* 182*  BUN 20 18  CREATININE 0.96 0.93  CALCIUM 8.9 8.6*   Liver Function Tests:  Recent Labs Lab 10/16/14 2358  AST 13*  ALT 7*  ALKPHOS 137*  BILITOT 0.4  PROT 6.4*  ALBUMIN 3.0*   CBC:  Recent Labs Lab 10/17/14 0456 10/18/14 0502 10/19/14 0513  WBC 7.0 5.5 5.9  HGB 8.4* 8.0* 9.0*  HCT 27.9* 27.0* 29.6*  MCV 84.8 84.9 85.8  PLT 202 205 204   CBG:  Recent Labs Lab 10/19/14 0007 10/19/14 0506 10/19/14 0733 10/19/14 1221 10/19/14 1652  GLUCAP 121* 117* 136* 167* 157*    Studies: No results found.  Scheduled Meds: . feeding supplement (GLUCERNA SHAKE)  237 mL Oral TID BM  . ferrous sulfate  325 mg Oral TID WC  . finasteride  5 mg Oral Daily  . insulin aspart  0-9 Units Subcutaneous 6 times per day  . metoprolol  tartrate  12.5 mg Oral BID  . sodium chloride  3 mL Intravenous Q12H  . tamsulosin  0.8 mg Oral Daily  . traZODone  50 mg Oral QHS   Continuous Infusions:    Active Problems:   Hematuria   Urinary retention   Urinary tract infection, site not specified   Hypotension   Benign essential HTN   CAD (coronary artery disease)   BPH (benign prostatic hyperplasia)   Systolic CHF (HCC)   CVA (cerebral infarction)    Time spent: 30 minutes     [name]  [facility] Pager [phone]. If 7PM-7AM, please contact night-coverage at [email or web address], password [facility] 10/19/2014, 6:52 PM  LOS: 3 days

## 2014-10-20 ENCOUNTER — Other Ambulatory Visit: Payer: Self-pay | Admitting: Urology

## 2014-10-20 DIAGNOSIS — J189 Pneumonia, unspecified organism: Secondary | ICD-10-CM

## 2014-10-20 LAB — GLUCOSE, CAPILLARY
GLUCOSE-CAPILLARY: 113 mg/dL — AB (ref 65–99)
GLUCOSE-CAPILLARY: 124 mg/dL — AB (ref 65–99)
GLUCOSE-CAPILLARY: 212 mg/dL — AB (ref 65–99)
Glucose-Capillary: 125 mg/dL — ABNORMAL HIGH (ref 65–99)
Glucose-Capillary: 150 mg/dL — ABNORMAL HIGH (ref 65–99)
Glucose-Capillary: 151 mg/dL — ABNORMAL HIGH (ref 65–99)
Glucose-Capillary: 157 mg/dL — ABNORMAL HIGH (ref 65–99)
Glucose-Capillary: 167 mg/dL — ABNORMAL HIGH (ref 65–99)
Glucose-Capillary: 203 mg/dL — ABNORMAL HIGH (ref 65–99)

## 2014-10-20 MED ORDER — ZOLPIDEM TARTRATE 5 MG PO TABS
5.0000 mg | ORAL_TABLET | Freq: Every evening | ORAL | Status: DC | PRN
Start: 2014-10-20 — End: 2014-10-21

## 2014-10-20 MED ORDER — DIPHENHYDRAMINE HCL 25 MG PO CAPS: 25.0000 mg | ORAL_CAPSULE | Freq: Every evening | ORAL | Status: DC | PRN

## 2014-10-20 NOTE — Progress Notes (Addendum)
  Subjective: Patient reports having had a good night last night.  He has no complaints of pain or other complaint.  Objective: Vital signs in last 24 hours: Temp:  [97.8 F (36.6 C)-98.5 F (36.9 C)] 97.8 F (36.6 C) (10/06 0422) Pulse Rate:  [69-100] 69 (10/06 0422) Resp:  [18-20] 18 (10/06 0422) BP: (100-130)/(55-73) 123/61 mmHg (10/06 0422) SpO2:  [95 %-100 %] 95 % (10/06 0422) Weight:  [70.3 kg (154 lb 15.7 oz)] 70.3 kg (154 lb 15.7 oz) (10/06 0422)  Intake/Output from previous day: 10/05 0701 - 10/06 0700 In: 9600 [P.O.:600] Out: 13150 [Urine:13150] Intake/Output this shift:    Physical Exam:  Abdomen is soft, flat and nontender with no suprapubic mass. His catheter is draining properly with no clots.  He is on slow CBI.  Lab Results:  Recent Labs  10/18/14 0502 10/19/14 0513  HGB 8.0* 9.0*  HCT 27.0* 29.6*   BMET No results for input(s): NA, K, CL, CO2, GLUCOSE, BUN, CREATININE, CALCIUM in the last 72 hours. No results for input(s): LABPT, INR in the last 72 hours. No results for input(s): LABURIN in the last 72 hours. No results found for this or any previous visit.  Studies/Results: No results found.  Assessment/Plan: Although he is doing well overall he continues to have mild gross hematuria.  His CBI is running at a very slow rate.  I'm going to try stopping CBI again today however if he continues to have difficulty with hematuria because he has been managed conservatively for some time now, despite the increased risk, I may need to proceed with cystoscopy.  He has a known markedly enlarged prostate which is almost certainly the source of his bleeding and may need to be fulgurated at the least or even possibly resected to some degree if surgery is undertaken.  CBI will be held and restarted as needed  I will try deflating his catheter balloon and reinflating only with 10 cc  Will monitor throughout the day and make a decision about surgical  intervention.   LOS: 4 days   Swetha Rayle C 10/20/2014, 7:46 AM   I went and rounded on him at noon today and it appears that removing some of the fluid from his Foley catheter balloon was successful at causing his hematuria to stop.  He has been off CBI and his urine had remained completely clear at noon.  I then checked on him again at 3 PM and his urine continued to remain completely clear with no clots off CBI.  I am remaining cautious and have made him n.p.o. but will remove his catheter now and check back on him again in the morning.  It appears he may be able to avoid surgery.

## 2014-10-20 NOTE — Progress Notes (Addendum)
Patient foley was remove at 2 pm and patient unable to void since then and when assessing the patient, Rn found a soaked wet wash cloth on the patients penis, but wife stated its probably a wash cloth that they used when he was eating. So RN unable to chart if its a urine. Bladder scan showed 75-100 ml. N.P. Informed. Ordered to recheck after 2 hours. Will continue to monitor

## 2014-10-20 NOTE — Progress Notes (Signed)
Physical Therapy Treatment Patient Details Name: Travis Costa MRN: [id number] DOB: 06/06/1924 Today's Date: 11-10-2014    History of Present Illness 81 y.o. male with a history of previous CVA without residual deficits in 1990, Recent NSTEMI and Small CVA and Thoracentesis for a pleural Effusion following aRight THA(on 09/19/2014) after a Mechanical Fall, and admitted to [facility] for hematuria    PT Comments    Pt able to tolerate ambulating 22 feet with RW today.  Pt requiring min assist today for mobility.  Follow Up Recommendations  Home health PT;Supervision for mobility/OOB     Equipment Recommendations  None recommended by PT    Recommendations for Other Services       Precautions / Restrictions Precautions Precautions: Fall;Posterior Hip Precaution Comments: hypotension issues after THA (esp during therapy) per spouse Restrictions Other Position/Activity Restrictions: spouse reports WBAT    Mobility  Bed Mobility Overal bed mobility: Needs Assistance Bed Mobility: Supine to Sit;Sit to Supine     Supine to sit: Min assist Sit to supine: Min assist   General bed mobility comments: awakened from sleeping today, assist for LEs, verbal cues for precautions  Transfers Overall transfer level: Needs assistance Equipment used: Rolling walker (2 wheeled) Transfers: Sit to/from Stand Sit to Stand: Min assist         General transfer comment: verbal cues for technique within precautions, assist to rise and steady as well as control descent  Ambulation/Gait Ambulation/Gait assistance: Min assist Ambulation Distance (Feet): 22 Feet Assistive device: Rolling walker (2 wheeled) Gait Pattern/deviations: Step-through pattern;Decreased stride length;Antalgic     General Gait Details: pt able to tolerate improving distance however fatigues quickly, verbal cues for turning toward unaffected LE   Stairs            Wheelchair Mobility    Modified Rankin (Stroke  Patients Only)       Balance                                    Cognition Arousal/Alertness: Awake/alert Behavior During Therapy: WFL for tasks assessed/performed Overall Cognitive Status: Within Functional Limits for tasks assessed                      Exercises      General Comments        Pertinent Vitals/Pain Pain Assessment: No/denies pain    Home Living                      Prior Function            PT Goals (current goals can now be found in the care plan section) Progress towards PT goals: Progressing toward goals    Frequency  Min 3X/week    PT Plan Current plan remains appropriate    Co-evaluation             End of Session Equipment Utilized During Treatment: Gait belt Activity Tolerance: Patient limited by fatigue Patient left: with call bell/phone within reach;with family/visitor present;in bed;with bed alarm set     Time: 7867-6720 PT Time Calculation (min) (ACUTE ONLY): 15 min  Charges:  $Gait Training: 8-22 mins                    G Codes:      Yuri Fana,KATHrine E 11-10-14, 1:05 PM Carmelia Bake, PT, DPT 11/10/14 Pager: [phone]

## 2014-10-20 NOTE — Progress Notes (Signed)
Shift event:  Brief pt hx: Admitted with hematuria and urology involved in case. Pt had bladder irrigation and hematuria cleared today.   RN, Junior, paged this NP secondary to Foley being removed at 1400 hrs and pt hasn't voided. On first page, the bladder scan was only 100cc, so this NP asked Junior to recheck bladder scan again in 2 hours if pt didn't void. Pt didn't void and bladder scan increased to over 200cc. Order to I&O cath. Spoke to family and they were OK with this after this NP explained that urology would be called if any difficulty during the cath. Afterwards, Junior paged NP and had successfully cathed pt for 220cc of clear urine. No hematuria. Will follow.  Clance Boll, NP [facility]

## 2014-10-20 NOTE — Progress Notes (Signed)
RN noticed foley stopped draining, irrigated foley, small amounts of clots noted. Foley now draining, CBI remains stopped. Will continue to monitor patient.

## 2014-10-20 NOTE — Progress Notes (Signed)
Nutrition Follow-up  DOCUMENTATION CODES:   Not applicable  INTERVENTION:  - Continue Glucerna TID - Encourage intakes of meals and supplements - RD will continue to monitor for needs  NUTRITION DIAGNOSIS:   Inadequate oral intake related to poor appetite as evidenced by per patient/family report. -ongoing  GOAL:   Patient will meet greater than or equal to 90% of their needs -unmet  MONITOR:   PO intake, Supplement acceptance, Weight trends, Labs, I & O's  ASSESSMENT:   81 y.o. male with a history of Previous CVA without residual deficits in 1990, Recent NSTEMI and Small CVA and Thoracentesis for a pleural Effusion following a Right THA (on 09/19/2014) after a Mechanical Fall, and history of HTN and BPH who had been admitted to [facility] and had a Right THA and was subsequently admitted to inpatient rehab. He developed urinary retention and hematuria and Urology at [facility] was consulted and placed a foley catheter with difficulty and Urine cultures were also sent, adn he was placed on IV Rocephin. He was placed on CBI but continued to have hematuria and pass clotts per family. So his family requested transfer to [facility] For evaluation adn treatement by his regular Urologist  10/6 Per chart review, pt ate 100% breakfast today and 90% breakfast and 75% lunch yesterday. Pt sleeping at time of visit and wife reports all information; she requests pt not be awoken to perform physical assessment.   For dinner last night pt's daughter brought him a sub and he ate 1/3 of a 6 inch sub. This AM son brought in a sausage biscuit and pt ate 100%. Pt broke his hip 1 month ago and since that time has had decreased appetite and intakes. Despite stroke at that time wife denies any new-onset chewing or swallowing issues. She states that she does not feel that pt has lost weight recently but is also unsure of UBW. She states that pt has been drinking Glucerna Shake  although he only drinks 1/3-1/2 can at any one time.   Wife states that permission was given for family to bring in foods that pt likes so that he will eat something; RD agrees that pt eating something is better than obtaining foods that he will refuse. Not meeting needs. Medications reviewed. Labs reviewed; CBGs: 105-167 mg/dL.   10/3 - Lab in with pt at first attempted visit, tech in to take vitals at time of second attempted visit, and pt being taken out of room at time of third attempted visit; wife was in room but left the room with pt.  - Unable to obtain information related to weight trends or appetite/intakes PTA and unable to perform physical assessment at this time.  - No previous weight hx available in chart.  - In rounds this AM RN requested that RD change Ensure Enlive to Glucerna Shake related to hx of DM.  Diet Order:  Diet heart healthy/carb modified Room service appropriate?: Yes; Fluid consistency:: Thin  Skin:  Reviewed, no issues  Last BM:  10/1  Height:   Ht Readings from Last 1 Encounters:  10/16/14 5\' 10"  (1.778 m)    Weight:   Wt Readings from Last 1 Encounters:  10/20/14 154 lb 15.7 oz (70.3 kg)    Ideal Body Weight:  75.45 kg (kg)  BMI:  Body mass index is 22.24 kg/(m^2).  Estimated Nutritional Needs:   Kcal:  1450-1650  Protein:  65-75 grams  Fluid:  2.2 L/day  EDUCATION NEEDS:  No education needs identified at this time     Travis Costa, [place], [facility] Inpatient Clinical Dietitian Pager # [phone] After hours/weekend pager # [phone]

## 2014-10-20 NOTE — Progress Notes (Signed)
[facility] PROGRESS NOTE  Travis Costa KCL:[id number] DOB: September 05, 1924 DOA: 10/16/2014 PCP: No primary care provider on file.  Assessment/Plan: 81 y/o male with PMH of HTN, BPH, CVA without residual deficits in 1990, recent Right THA(09/19/2014) after a Mechanical Fall, hospitalization complicated with NSTEMI, Small CVA, pneumonia pleural Effusion requiring thoracentesis, chronic BPH who had been admitted to [facility] sent to rehab after Right THA. -He developed urinary retention and hematuria and Urology at [facility] was consulted and placed a foley catheter with difficulty, placed on IV Rocephin for UTI. He was placed on CBI but continued to have hematuria and pass clotts per family. So his family requested transfer to [facility] For evaluation adn treatement by his regular Urologist Dr Karsten Ro who has taken care of him for the past 20 years.  1. Hematuria-suspected due to bleeding from prostate; appears to subside; currently off CBI. Urinary retention, with plans for voiding trial. . Also CT abd demonstrated none-obstructive nephrolithiasis.  Will continue flomax (dose increased to 0.8 mg) and finasteride. Urology following. Will follow recommendations. 2. Acute blood loss anemia. Hematuria. Hg-8.6->8.0>>9.0. Will monitor with CBC in am. No need for transfusion at this moment. Will Transfuse if <7.5. Continue Iron supplements   3. Elevated PSA. Urology is following; will follow rec's 4. Recent Pneumonia. Pleural effusion. Clinically stable. Repeat CXR: no clear infiltrates  5. UTI. Apparently  patient was started on IV ceftriaxone at [facility]. He received ceftriaxone for 2 more day here at [facility]. He remains afebrile. No leukocytosis. clinically stable. Will continue Monitoring off antibiotics 6. DM. ha1c-6.7. Cont SSI and modified carb diet. CBG's stable 7.physical deconditioning: will arrange home health therapy at discharge as recommended by PT  Code  Status: full code Family Communication: d/w patient, his wife at bedside Disposition Plan: home with home health services at discharge, will follow clinical improvement    Consultants:  Urology   Procedures:  Patient was on CBI  Antibiotics:  Ceftriaxone  Stopped on 10/04  HPI/Subjective: No fever, no abd pain, no CP, no SOB. Patient urine is clear and good urine output so far.  Objective: Filed Vitals:   10/20/14 1327  BP: 98/52  Pulse: 91  Temp: 97.8 F (36.6 C)  Resp: 18    Intake/Output Summary (Last 24 hours) at 10/20/14 1742 Last data filed at 10/20/14 1639  Gross per 24 hour  Intake   6480 ml  Output   8700 ml  Net  -2220 ml   Filed Weights   10/18/14 0500 10/19/14 0453 10/20/14 0422  Weight: 73.4 kg (161 lb 13.1 oz) 70.8 kg (156 lb 1.4 oz) 70.3 kg (154 lb 15.7 oz)    Exam:   General:  Alert, awake and oriented 3. Patient denies chest pain, shortness of breath or any other acute complaints. He reports having a good night. Urine is clear today.  Cardiovascular: s1,s2 rrr  Respiratory: CTA BL  Abdomen: soft, nt,nd   Musculoskeletal: no leg edema   Data Reviewed: Basic Metabolic Panel:  Recent Labs Lab 10/16/14 2358 10/17/14 0456  NA 137 136  K 4.6 4.5  CL 102 101  CO2 29 27  GLUCOSE 141* 182*  BUN 20 18  CREATININE 0.96 0.93  CALCIUM 8.9 8.6*   Liver Function Tests:  Recent Labs Lab 10/16/14 2358  AST 13*  ALT 7*  ALKPHOS 137*  BILITOT 0.4  PROT 6.4*  ALBUMIN 3.0*   CBC:  Recent Labs Lab 10/17/14 0456 10/18/14 0502 10/19/14 4944  WBC 7.0 5.5 5.9  HGB 8.4* 8.0* 9.0*  HCT 27.9* 27.0* 29.6*  MCV 84.8 84.9 85.8  PLT 202 205 204   CBG:  Recent Labs Lab 10/20/14 0435 10/20/14 0723 10/20/14 1202 10/20/14 1216 10/20/14 1619  GLUCAP 113* 124* 212* 203* 151*    Studies: No results found.  Scheduled Meds: . feeding supplement (GLUCERNA SHAKE)  237 mL Oral TID BM  . ferrous sulfate  325 mg Oral TID WC  .  finasteride  5 mg Oral Daily  . insulin aspart  0-9 Units Subcutaneous 6 times per day  . metoprolol tartrate  12.5 mg Oral BID  . sodium chloride  3 mL Intravenous Q12H  . tamsulosin  0.8 mg Oral Daily  . traZODone  50 mg Oral QHS   Continuous Infusions:    Active Problems:   Hematuria   Urinary retention   Urinary tract infection, site not specified   Hypotension   Benign essential HTN   CAD (coronary artery disease)   BPH (benign prostatic hyperplasia)   Systolic CHF (HCC)   CVA (cerebral infarction)    Time spent: 30 minutes     [name]  [facility] Pager [phone]. If 7PM-7AM, please contact night-coverage at [email or web address], password [facility] 10/20/2014, 5:42 PM  LOS: 4 days

## 2014-10-20 NOTE — Progress Notes (Signed)
Rechecked patient after 2 hours, still no urine output, then bladder scan showed 185-21ml, N.P. Informed and ordered in and out. RN did in and out, 224ml was drained, urine looks normal, no blood in the urine. N.P. Infomed. No new orders. Will continue to monitor.

## 2014-10-21 ENCOUNTER — Encounter (HOSPITAL_COMMUNITY)
Admission: AD | Disposition: A | Discharge status: Home Health | Payer: Self-pay | Source: Other Acute Inpatient Hospital | Attending: Internal Medicine

## 2014-10-21 DIAGNOSIS — R310 Gross hematuria: Secondary | ICD-10-CM

## 2014-10-21 DIAGNOSIS — D62 Acute posthemorrhagic anemia: Secondary | ICD-10-CM | POA: Insufficient documentation

## 2014-10-21 LAB — PSA: PSA: 27.09 ng/mL — ABNORMAL HIGH (ref 0.00–4.00)

## 2014-10-21 LAB — BASIC METABOLIC PANEL
Anion gap: 7 (ref 5–15)
BUN: 16 mg/dL (ref 6–20)
CHLORIDE: 106 mmol/L (ref 101–111)
CO2: 26 mmol/L (ref 22–32)
CREATININE: 0.97 mg/dL (ref 0.61–1.24)
Calcium: 8.7 mg/dL — ABNORMAL LOW (ref 8.9–10.3)
GFR calc non Af Amer: >60 mL/min (ref >60)
Glucose, Bld: 127 mg/dL — ABNORMAL HIGH (ref 65–99)
POTASSIUM: 3.7 mmol/L (ref 3.5–5.1)
SODIUM: 139 mmol/L (ref 135–145)

## 2014-10-21 LAB — CBC
HCT: 30.5 % — ABNORMAL LOW (ref 39.0–52.0)
HEMOGLOBIN: 9.2 g/dL — AB (ref 13.0–17.0)
MCH: 26.2 pg (ref 26.0–34.0)
MCHC: 30.2 g/dL (ref 30.0–36.0)
MCV: 86.9 fL (ref 78.0–100.0)
Platelets: 209 10*3/uL (ref 150–400)
RBC: 3.51 MIL/uL — AB (ref 4.22–5.81)
RDW: 16.2 % — ABNORMAL HIGH (ref 11.5–15.5)
WBC: 4.8 10*3/uL (ref 4.0–10.5)

## 2014-10-21 LAB — GLUCOSE, CAPILLARY
GLUCOSE-CAPILLARY: 106 mg/dL — AB (ref 65–99)
Glucose-Capillary: 116 mg/dL — ABNORMAL HIGH (ref 65–99)
Glucose-Capillary: 137 mg/dL — ABNORMAL HIGH (ref 65–99)

## 2014-10-21 SURGERY — CYSTOSCOPY
Anesthesia: General

## 2014-10-21 MED ORDER — FINASTERIDE 5 MG PO TABS: 5.0000 mg | ORAL_TABLET | Freq: Every day | ORAL | Status: DC

## 2014-10-21 MED ORDER — TAMSULOSIN HCL 0.4 MG PO CAPS: 0.8000 mg | ORAL_CAPSULE | Freq: Every day | ORAL | Status: AC

## 2014-10-21 MED ORDER — FERROUS SULFATE 325 (65 FE) MG PO TABS: 325.0000 mg | ORAL_TABLET | Freq: Two times a day (BID) | ORAL | Status: DC

## 2014-10-21 MED ORDER — METOPROLOL TARTRATE 25 MG PO TABS: 12.5000 mg | ORAL_TABLET | Freq: Two times a day (BID) | ORAL | Status: DC

## 2014-10-21 MED ORDER — GLUCERNA SHAKE PO LIQD: 237.0000 mL | Freq: Three times a day (TID) | ORAL | Status: AC

## 2014-10-21 NOTE — Progress Notes (Signed)
Subjective: Patient reports no complaints this morning. Specifically he is not having any suprapubic discomfort or feeling of needing to urinate currently.  Objective: Vital signs in last 24 hours: Temp:  [97.3 F (36.3 C)-97.8 F (36.6 C)] 97.3 F (36.3 C) (10/07 0420) Pulse Rate:  [49-98] 84 (10/07 0420) Resp:  [18-20] 20 (10/07 0420) BP: (98-110)/(52-82) 104/66 mmHg (10/07 0420) SpO2:  [97 %-99 %] 98 % (10/07 0420) Weight:  [70.1 kg (154 lb 8.7 oz)] 70.1 kg (154 lb 8.7 oz) (10/07 0420)  Intake/Output from previous day: 10/06 0701 - 10/07 0700 In: 690 [P.O.:690] Out: 1770 [Urine:1770] Intake/Output this shift:    Physical Exam:  His abdomen is soft and there is no suprapubic mass or tenderness.  Lab Results:  Recent Labs  10/19/14 0513 10/21/14 0540  HGB 9.0* 9.2*  HCT 29.6* 30.5*   BMET  Recent Labs  10/21/14 0540  NA 139  K 3.7  CL 106  CO2 26  GLUCOSE 127*  BUN 16  CREATININE 0.97  CALCIUM 8.7*   No results for input(s): LABPT, INR in the last 72 hours. No results for input(s): LABURIN in the last 72 hours. No results found for this or any previous visit.  Studies/Results: No results found.  Assessment/Plan: I deflated his balloon yesterday and after doing this his urine cleared up. I checked on him several times and his urine remained clear and therefore I removed his catheter. He is not receiving any intravenous fluids and it had been some time since he had his catheter removed and there was a concern about retention. Eventually he was in and out catheterized but only had 200 mL in his bladder. This is not enough to require in and out catheterization and due to the fact that he has had difficulty with hematuria secondary to his prostate I would recommend avoiding in and out catheterizations unless the patient is uncomfortable and unable to urinate. He is currently comfortable and I have recommended that he for some PO fluids today. If he does become  uncomfortable I would first check a bladder scan PVR and if 400 mL or greater are noted in the bladder and then my recommendation would be to replace a Foley catheter using at least an 36 French catheter since this patient has had hematuria in the past a 14 or 16 French catheter would be inappropriate. Fortunately his hemoglobin has actually increased the past 2 days.   I discussed with the patient and his family this morning the fact that acute debilitation in an octogenarian can often result in urinary retention especially with a known markedly enlarged prostate. If a Foley catheter is required one may need to be replaced and he could potentially be discharged home with his Foley catheter. He will need to remain on tamsulosin 0.8 mg daily as well as finasteride. He will then follow up with me as an outpatient.  He was found to have a markedly elevated PSA of 33.88 however there had been a great deal of prostate and bladder manipulation with clot retention and I therefore repeated the PSA today. He has a history of an elevated PSA that has been followed for years and his last PSA value in my office was 17. I will follow up on his elevated PSA as an outpatient as well.  Forced fluids  Because his hematuria has ceased I have canceled his surgery for this afternoon.  Replace Foley if patient unable to void  He could potentially be discharged today  if he voids or if a catheter is required with follow-up as an outpatient.   LOS: 5 days   Raphaella Larkin C 10/21/2014, 7:10 AM

## 2014-10-21 NOTE — Progress Notes (Addendum)
Went over all discharge information with patient and family. Discharge with foley catheter per MD verbal order.   Did foley care and leg back teaching, all questions answered.  VSS.  Discharge AVS and prescriptions given.  Patient wheeled out by NT.

## 2014-10-21 NOTE — Progress Notes (Signed)
Pt is being discharged today. Travis Costa [facility] was made aware.

## 2014-10-21 NOTE — Discharge Summary (Signed)
Physician Discharge Summary  Travis Costa XBD:[id number] DOB: 10-02-1924 DOA: 10/16/2014  PCP: No primary care provider on file.  Admit date: 10/16/2014 Discharge date: 10/21/2014  Time spent: > 30 minutes  Recommendations for Outpatient Follow-up:  Repeat BMET to follow electrolytes and renal function Check CBC to follow Hgb trend Arrange Follow up with Dr. Karsten Ro in 1-2 weeks to follow BPH and hematuria  Avoid the use of NSAID's for now; reassess ability to resumed ASA if not further bleeding appreciated in the next 2-3 weeks Reassess BP and adjust antihypertensive regimen   Discharge Diagnoses:  Active Problems:   Hematuria   Acute blood loss anemia due to hematuria   Urinary retention   Urinary tract infection, site not specified   Benign essential HTN   CAD (coronary artery disease)   BPH (benign prostatic hyperplasia)   Hx of chronic Systolic CHF (HCC)   Hx of CVA (cerebral infarction) Controlled type diabetes, not on insulin therapy   Discharge Condition: stable and improved. Discharge home with [facility] services and follow up with PCP and urology as an outpatient.  Diet recommendation: heart healthy and low carb diet  Filed Weights   10/19/14 0453 10/20/14 0422 10/21/14 0420  Weight: 70.8 kg (156 lb 1.4 oz) 70.3 kg (154 lb 15.7 oz) 70.1 kg (154 lb 8.7 oz)    History of present illness:  81 y.o. male with a history of Previous CVA without residual deficits in 1990, Recent NSTEMI and Small CVA and Thoracentesis for a pleural Effusion following a Right THA (on 09/19/2014) after a Mechanical Fall, and history of HTN and BPH who had been admitted to [facility] and had a Right THA and was subsequently admitted to inpatient rehab. He developed urinary retention and hematuria and Urology at [facility] was consulted and placed a foley catheter with difficulty and Urine cultures were also sent, adn he was placed on IV Rocephin. He was placed on CBI but continued  to have hematuria and pass clotts per family. So his family requested transfer to [facility] For evaluation adn treatement by his regular Urologist Dr Karsten Ro who has taken care of him for the past 20 years.  Hospital Course:  1. Hematuria/urinary retention-suspected due to bleeding from enlarged prostate and bladder clotts; appears to have subside; remained off CBI for 36 hours and no further bleeding. Urinary retention, with failed voiding trial prior to discharge. Will go home with Foley catheter and follow up with urology service as an outpatient. Will continue flomax (dose increased to 0.8 mg) and finasteride.  2. Acute blood loss anemia. Hematuria. Hg-8.6->8.0>>9.0>>9.2. Will monitor with CBC during follow up visit. No need for transfusion during this admission. Continue Iron supplements  3. Elevated PSA. Urology is following and will continue outpatient care for him. (Dr. Karsten Ro) 4. Recent Pneumonia. Pleural effusion. Clinically stable. Repeated CXR: no clear infiltrates  5. UTI. Apparently patient was started on IV ceftriaxone at [facility]. He received ceftriaxone for 2 more day after transfer to complete antibiotic therapy. No fever, no dysuria, WBC's WNL. Will Monitor off antibiotics 6. DM. ha1c-6.7. Continue home hypoglycemic regimen and low carb diet  7.physical deconditioning: will go home with arranged home health therapy as recommended by PT 8. Hx of CAD: no CP or SOB during this hospitalization. Will continue low dose BB and after 2 weeks reevaluate for initiation on ASA, if ok by urology service. 9.chronic systolic CHF: follow daily weights, low sodium diet and will continue B-blocker and low dose  HCTZ. Condition is compensated 10. Hx of CVA: no new deficit appreciated. Patient will need aspirin for secondary prevention once no further hematuria appreciated. Continue risk factors modifications.   *rest of medical problems remains stable. Patient will follow with PCP for  further care and adjustment on his medication regimen as needed.   Procedures:  Patient was on CBI (continues bladder irrigation)  Consultations:  Urology service   Discharge Exam: Filed Vitals:   10/21/14 1448  BP: 101/63  Pulse: 95  Temp: 98.5 F (36.9 C)  Resp: 18    General: no fever, no further hematuria. Patient with urinary retention and need for foley cath at discharge. No nausea, no vomiting and tolerating PO intake and PO meds. Cardiovascular: S1 and S2, no rubs or gallops Respiratory: good air movement, no wheezing, no crackles, no rales  Abd: soft, NT, ND, positive BS Extremities: no edema, no cyanosis, no clubbing   Discharge Instructions   Discharge Instructions    Discharge instructions    Complete by:  As directed   Take medications as prescribed Follow up with PCP in 10 days Call Dr. Karsten Ro next week for follow up appointment details  Avoid the use of NSAID's for now Maintain adequate hydration Follow low sodium/heart healthy diet          Current Discharge Medication List    START taking these medications   Details  feeding supplement, GLUCERNA SHAKE, (GLUCERNA SHAKE) LIQD Take 237 mLs by mouth 3 (three) times daily between meals.    ferrous sulfate 325 (65 FE) MG tablet Take 1 tablet (325 mg total) by mouth 2 (two) times daily with a meal. Qty: 60 tablet, Refills: 2    finasteride (PROSCAR) 5 MG tablet Take 1 tablet (5 mg total) by mouth daily. Qty: 30 tablet, Refills: 1    metoprolol tartrate (LOPRESSOR) 25 MG tablet Take 0.5 tablets (12.5 mg total) by mouth 2 (two) times daily. Qty: 60 tablet, Refills: 1      CONTINUE these medications which have CHANGED   Details  tamsulosin (FLOMAX) 0.4 MG CAPS capsule Take 2 capsules (0.8 mg total) by mouth daily. Qty: 60 capsule, Refills: 0      CONTINUE these medications which have NOT CHANGED   Details  Glucosamine-Chondroitin-MSM 500-200-150 MG TABS Take 1 tablet by mouth daily.     hydrochlorothiazide (MICROZIDE) 12.5 MG capsule Take 12.5 mg by mouth daily.    omeprazole (PRILOSEC) 20 MG capsule Take 20 mg by mouth daily as needed. For acid reflux    sitaGLIPtin (JANUVIA) 100 MG tablet Take 50 mg by mouth daily.    trandolapril (MAVIK) 4 MG tablet Take 4 mg by mouth daily.      STOP taking these medications     verapamil (CALAN-SR) 240 MG CR tablet        Allergies  Allergen Reactions  . Percocet [Oxycodone-Acetaminophen] Other (See Comments)    Confusion   Follow-up Information    Call Claybon Jabs, MD.   Specialty:  Urology   Why:  For an appointment in 1-2 weeks when you get home; call office for appointment details   Contact information:   [place] [address] [phone]       The results of significant diagnostics from this hospitalization (including imaging, microbiology, ancillary and laboratory) are listed below for reference.    Significant Diagnostic Studies: Ct Abdomen Pelvis W Wo Contrast  10/17/2014   CLINICAL DATA:  Initial encounter for gross hematuria  EXAM: CT ABDOMEN AND PELVIS WITHOUT AND WITH CONTRAST  TECHNIQUE: Multidetector CT imaging of the abdomen and pelvis was performed following the standard protocol before and following the bolus administration of intravenous contrast.  CONTRAST:  132mL OMNIPAQUE IOHEXOL 300 MG/ML  SOLN  COMPARISON:  None.  FINDINGS: Lower chest: Dependent atelectasis noted in the lower lobes with small bilateral pleural effusions.  Hepatobiliary: No focal abnormality in the liver on this study without intravenous contrast. No evidence of hepatomegaly. Gallbladder is distended without evidence of stones. No intrahepatic or extrahepatic biliary dilation.  Pancreas: Pancreas is diffusely atrophic without dilatation of the main duct.  Spleen: No splenomegaly. No focal mass lesion.  Adrenals/Urinary Tract: No adrenal nodule or mass.  Pre contrast imaging shows no stones in the right kidney.  Despite the relative lack of secondary changes in the right kidney and ureter, a 5 x 5 x 7 mm stone is identified in the distal right ureter approximately 1-2 cm proximal to the UVJ. Several small cysts are seen in the right kidney. 5 mm hyper attenuating lesion (image 58 series 2) is too small to definitively characterize but may be a cyst complicated by proteinaceous debris or hemorrhage. Delayed imaging shows no abnormality in the right intrarenal collecting system or renal pelvis. Right ureter is well opacified down to the level of the stone and is unremarkable.  2 mm nonobstructing stone is identified in the interpolar left kidney. No evidence for left ureteral stones. Delayed imaging shows no abnormality of the intrarenal collecting system or renal pelvis. The left ureter is not well opacified but shows no evidence for focal hydroureter.  A large irregular filling defect in the bladder lumen suggest the presence of a blood clot. A urinary catheter is visualized in situ. Gas within the bladder is presumably secondary to the instrumentation.  Stomach/Bowel: Stomach is nondistended. No gastric wall thickening. No evidence of outlet obstruction. Duodenum is normally positioned as is the ligament of Treitz. No small bowel wall thickening. No small bowel dilatation. The terminal ileum is normal. The appendix is not visualized, but there is no edema or inflammation in the region of the cecum. Diverticular changes are noted in the left colon without evidence of diverticulitis.  Vascular/Lymphatic: There is abdominal aortic atherosclerosis without aneurysm. There is no gastrohepatic or hepatoduodenal ligament lymphadenopathy. No intraperitoneal or retroperitoneal lymphadenopy. No pelvic sidewall lymphadenopathy.  Reproductive: Prostate gland is markedly enlarged and heterogeneous.  Other: No intraperitoneal free fluid.  Musculoskeletal: Patient is status post ORIF for right femoral neck fracture. Bones are diffusely  demineralized. Bone windows reveal no worrisome lytic or sclerotic osseous lesions.  IMPRESSION: 1. 5 x 5 x 7 mm stone in the distal right ureter approximately 10-20 mm above the UVJ. No substantial secondary change in the right kidney or ureter. 2. 2 mm nonobstructing stone in the interpolar left kidney. 3. No evidence for enhancing renal mass. There is a 5 mm hyper attenuating lesion in the medial left kidney which may be a complex cyst, but is too small to definitively characterize. 4. Large irregular filling defect in the bladder lumen most consistent with clot. This could obscure small mucosal lesions. 5. Marked prostatomegaly.   Electronically Signed   By: Misty Stanley M.D.   On: 10/17/2014 16:24   Dg Chest 2 View  10/17/2014   CLINICAL DATA:  Cough and weakness, history of CHF, pleural effusions, hematuria.  EXAM: CHEST  2 VIEW  COMPARISON:  None in PACs  FINDINGS: The lungs are  adequately inflated. Mild elevation of the left hemidiaphragm is noted but may be positional. The heart and pulmonary vascularity are normal. The pulmonary interstitial markings are coarse. The pulmonary vascularity is not engorged. There is a small pleural effusion on the right layering posteriorly. The bony thorax exhibits no acute abnormality.  IMPRESSION: Small right pleural effusion. No definite evidence of pneumonia nor pulmonary edema.   Electronically Signed   By: David  Martinique M.D.   On: 10/17/2014 14:55    Labs: Basic Metabolic Panel:  Recent Labs Lab 10/16/14 2358 10/17/14 0456 10/21/14 0540  NA 137 136 139  K 4.6 4.5 3.7  CL 102 101 106  CO2 29 27 26   GLUCOSE 141* 182* 127*  BUN 20 18 16   CREATININE 0.96 0.93 0.97  CALCIUM 8.9 8.6* 8.7*   Liver Function Tests:  Recent Labs Lab 10/16/14 2358  AST 13*  ALT 7*  ALKPHOS 137*  BILITOT 0.4  PROT 6.4*  ALBUMIN 3.0*   CBC:  Recent Labs Lab 10/17/14 0456 10/18/14 0502 10/19/14 0513 10/21/14 0540  WBC 7.0 5.5 5.9 4.8  HGB 8.4* 8.0* 9.0*  9.2*  HCT 27.9* 27.0* 29.6* 30.5*  MCV 84.8 84.9 85.8 86.9  PLT 202 205 204 209    CBG:  Recent Labs Lab 10/20/14 2011 10/20/14 2346 10/21/14 0419 10/21/14 0731 10/21/14 1218  GLUCAP 167* 157* 106* 116* 137*    Signed:  [name]  [facility] 10/21/2014, 4:21 PM

## 2014-10-21 NOTE — Progress Notes (Addendum)
Patient has not urinated since I and O cath last night around midnight.  Bladder scan showed 350cc's. MD made aware.     Per urologist- insert foley catheter.  Catheter inserted with Shirlee Limerick, RN, 300 cc's of urine light yellow urine returned.

## 2014-10-21 NOTE — Progress Notes (Signed)
Physical Therapy Treatment Patient Details Name: Travis Costa MRN: [id number] DOB: 02/22/1924 Today's Date: 10/21/2014    History of Present Illness 81 y.o. male with a history of previous CVA without residual deficits in 1990, Recent NSTEMI and Small CVA and Thoracentesis for a pleural Effusion following aRight THA(on 09/19/2014) after a Mechanical Fall, and admitted to [facility] for hematuria    PT Comments    Per spouse pt has been in the for several weeks.  "he had a heart attack after his hip replacement".  Assisted pt OOB to amb to bathroom for attempted void.  Then required a sitting rest break.  Assisted with amb pt in hallway but then reuired a sitting rest break.  Then amb back into room. Very deconditioned.  Assisted to recliner and positioned to comfort.   Follow Up Recommendations  Home health PT;Supervision for mobility/OOB     Equipment Recommendations  None recommended by PT    Recommendations for Other Services       Precautions / Restrictions Precautions Precautions: Fall;Posterior Hip Restrictions Weight Bearing Restrictions: No Other Position/Activity Restrictions: spouse reports WBAT    Mobility  Bed Mobility Overal bed mobility: Needs Assistance Bed Mobility: Supine to Sit     Supine to sit: Min assist Sit to supine: Min assist   General bed mobility comments: 75% VC's on proper tech and hand placement plus increased time  Transfers Overall transfer level: Needs assistance Equipment used: Rolling walker (2 wheeled) Transfers: Sit to/from Stand Sit to Stand: Min assist         General transfer comment: verbal cues for technique within precautions, assist to rise and steady as well as control descent.  VC's safety with turns.  Ambulation/Gait Ambulation/Gait assistance: Min assist Ambulation Distance (Feet): 45 Feet (15 feet x 3 long sitting rest break between) Assistive device: Rolling walker (2 wheeled) Gait Pattern/deviations: Step-through  pattern;Decreased stride length;Trunk flexed Gait velocity: decreased   General Gait Details: very limited activity tolertance.  Required freq rest breaks. Noted 3/4 DOE.    Stairs            Wheelchair Mobility    Modified Rankin (Stroke Patients Only)       Balance                                    Cognition Arousal/Alertness: Awake/alert Behavior During Therapy: WFL for tasks assessed/performed Overall Cognitive Status: Within Functional Limits for tasks assessed                      Exercises      General Comments        Pertinent Vitals/Pain Pain Assessment: No/denies pain    Home Living                      Prior Function            PT Goals (current goals can now be found in the care plan section) Progress towards PT goals: Progressing toward goals    Frequency  Min 3X/week    PT Plan Current plan remains appropriate    Co-evaluation             End of Session Equipment Utilized During Treatment: Gait belt Activity Tolerance: Patient limited by fatigue Patient left: with call bell/phone within reach;with family/visitor present;in bed;with bed alarm set     Time: 4239-5320 PT  Time Calculation (min) (ACUTE ONLY): 25 min  Charges:  $Gait Training: 8-22 mins $Therapeutic Activity: 8-22 mins                    G Codes:      Rica Koyanagi  PTA [facility]  Acute  Rehab Pager      [phone]

## 2014-10-22 DIAGNOSIS — R338 Other retention of urine: Secondary | ICD-10-CM | POA: Diagnosis not present

## 2014-10-22 DIAGNOSIS — Z9181 History of falling: Secondary | ICD-10-CM | POA: Diagnosis not present

## 2014-10-22 DIAGNOSIS — Z7984 Long term (current) use of oral hypoglycemic drugs: Secondary | ICD-10-CM | POA: Diagnosis not present

## 2014-10-22 DIAGNOSIS — I5020 Unspecified systolic (congestive) heart failure: Secondary | ICD-10-CM | POA: Diagnosis not present

## 2014-10-22 DIAGNOSIS — I214 Non-ST elevation (NSTEMI) myocardial infarction: Secondary | ICD-10-CM | POA: Diagnosis not present

## 2014-10-22 DIAGNOSIS — Z466 Encounter for fitting and adjustment of urinary device: Secondary | ICD-10-CM | POA: Diagnosis not present

## 2014-10-22 DIAGNOSIS — N401 Enlarged prostate with lower urinary tract symptoms: Secondary | ICD-10-CM | POA: Diagnosis not present

## 2014-10-22 DIAGNOSIS — Z8673 Personal history of transient ischemic attack (TIA), and cerebral infarction without residual deficits: Secondary | ICD-10-CM | POA: Diagnosis not present

## 2014-10-22 DIAGNOSIS — S72141D Displaced intertrochanteric fracture of right femur, subsequent encounter for closed fracture with routine healing: Secondary | ICD-10-CM | POA: Diagnosis not present

## 2014-10-22 DIAGNOSIS — E1165 Type 2 diabetes mellitus with hyperglycemia: Secondary | ICD-10-CM | POA: Diagnosis not present

## 2014-10-22 DIAGNOSIS — I110 Hypertensive heart disease with heart failure: Secondary | ICD-10-CM | POA: Diagnosis not present

## 2014-10-22 DIAGNOSIS — I2510 Atherosclerotic heart disease of native coronary artery without angina pectoris: Secondary | ICD-10-CM | POA: Diagnosis not present

## 2014-10-24 DIAGNOSIS — N401 Enlarged prostate with lower urinary tract symptoms: Secondary | ICD-10-CM | POA: Diagnosis not present

## 2014-10-24 DIAGNOSIS — E1165 Type 2 diabetes mellitus with hyperglycemia: Secondary | ICD-10-CM | POA: Diagnosis not present

## 2014-10-24 DIAGNOSIS — R338 Other retention of urine: Secondary | ICD-10-CM | POA: Diagnosis not present

## 2014-10-24 DIAGNOSIS — I5020 Unspecified systolic (congestive) heart failure: Secondary | ICD-10-CM | POA: Diagnosis not present

## 2014-10-24 DIAGNOSIS — I110 Hypertensive heart disease with heart failure: Secondary | ICD-10-CM | POA: Diagnosis not present

## 2014-10-24 DIAGNOSIS — I10 Essential (primary) hypertension: Secondary | ICD-10-CM | POA: Diagnosis not present

## 2014-10-24 DIAGNOSIS — S72141D Displaced intertrochanteric fracture of right femur, subsequent encounter for closed fracture with routine healing: Secondary | ICD-10-CM | POA: Diagnosis not present

## 2014-10-26 DIAGNOSIS — S72141D Displaced intertrochanteric fracture of right femur, subsequent encounter for closed fracture with routine healing: Secondary | ICD-10-CM | POA: Diagnosis not present

## 2014-10-26 DIAGNOSIS — N401 Enlarged prostate with lower urinary tract symptoms: Secondary | ICD-10-CM | POA: Diagnosis not present

## 2014-10-26 DIAGNOSIS — E1165 Type 2 diabetes mellitus with hyperglycemia: Secondary | ICD-10-CM | POA: Diagnosis not present

## 2014-10-26 DIAGNOSIS — I110 Hypertensive heart disease with heart failure: Secondary | ICD-10-CM | POA: Diagnosis not present

## 2014-10-26 DIAGNOSIS — R338 Other retention of urine: Secondary | ICD-10-CM | POA: Diagnosis not present

## 2014-10-26 DIAGNOSIS — I5020 Unspecified systolic (congestive) heart failure: Secondary | ICD-10-CM | POA: Diagnosis not present

## 2014-10-27 DIAGNOSIS — S72141D Displaced intertrochanteric fracture of right femur, subsequent encounter for closed fracture with routine healing: Secondary | ICD-10-CM | POA: Diagnosis not present

## 2014-10-27 DIAGNOSIS — E1165 Type 2 diabetes mellitus with hyperglycemia: Secondary | ICD-10-CM | POA: Diagnosis not present

## 2014-10-27 DIAGNOSIS — N401 Enlarged prostate with lower urinary tract symptoms: Secondary | ICD-10-CM | POA: Diagnosis not present

## 2014-10-27 DIAGNOSIS — Z0000 Encounter for general adult medical examination without abnormal findings: Secondary | ICD-10-CM | POA: Diagnosis not present

## 2014-10-27 DIAGNOSIS — I10 Essential (primary) hypertension: Secondary | ICD-10-CM | POA: Diagnosis not present

## 2014-10-27 DIAGNOSIS — R338 Other retention of urine: Secondary | ICD-10-CM | POA: Diagnosis not present

## 2014-10-27 DIAGNOSIS — I110 Hypertensive heart disease with heart failure: Secondary | ICD-10-CM | POA: Diagnosis not present

## 2014-10-27 DIAGNOSIS — I639 Cerebral infarction, unspecified: Secondary | ICD-10-CM | POA: Diagnosis not present

## 2014-10-27 DIAGNOSIS — I214 Non-ST elevation (NSTEMI) myocardial infarction: Secondary | ICD-10-CM | POA: Diagnosis not present

## 2014-10-27 DIAGNOSIS — I5020 Unspecified systolic (congestive) heart failure: Secondary | ICD-10-CM | POA: Diagnosis not present

## 2014-10-28 DIAGNOSIS — R338 Other retention of urine: Secondary | ICD-10-CM | POA: Diagnosis not present

## 2014-10-28 DIAGNOSIS — R972 Elevated prostate specific antigen [PSA]: Secondary | ICD-10-CM | POA: Diagnosis not present

## 2014-10-28 DIAGNOSIS — E1165 Type 2 diabetes mellitus with hyperglycemia: Secondary | ICD-10-CM | POA: Diagnosis not present

## 2014-10-28 DIAGNOSIS — N401 Enlarged prostate with lower urinary tract symptoms: Secondary | ICD-10-CM | POA: Diagnosis not present

## 2014-10-28 DIAGNOSIS — I5020 Unspecified systolic (congestive) heart failure: Secondary | ICD-10-CM | POA: Diagnosis not present

## 2014-10-28 DIAGNOSIS — S72141D Displaced intertrochanteric fracture of right femur, subsequent encounter for closed fracture with routine healing: Secondary | ICD-10-CM | POA: Diagnosis not present

## 2014-10-28 DIAGNOSIS — N201 Calculus of ureter: Secondary | ICD-10-CM | POA: Diagnosis not present

## 2014-10-28 DIAGNOSIS — R339 Retention of urine, unspecified: Secondary | ICD-10-CM | POA: Diagnosis not present

## 2014-10-28 DIAGNOSIS — N138 Other obstructive and reflux uropathy: Secondary | ICD-10-CM | POA: Diagnosis not present

## 2014-10-28 DIAGNOSIS — I110 Hypertensive heart disease with heart failure: Secondary | ICD-10-CM | POA: Diagnosis not present

## 2014-10-31 DIAGNOSIS — I5020 Unspecified systolic (congestive) heart failure: Secondary | ICD-10-CM | POA: Diagnosis not present

## 2014-10-31 DIAGNOSIS — R338 Other retention of urine: Secondary | ICD-10-CM | POA: Diagnosis not present

## 2014-10-31 DIAGNOSIS — S72141D Displaced intertrochanteric fracture of right femur, subsequent encounter for closed fracture with routine healing: Secondary | ICD-10-CM | POA: Diagnosis not present

## 2014-10-31 DIAGNOSIS — E1165 Type 2 diabetes mellitus with hyperglycemia: Secondary | ICD-10-CM | POA: Diagnosis not present

## 2014-10-31 DIAGNOSIS — N401 Enlarged prostate with lower urinary tract symptoms: Secondary | ICD-10-CM | POA: Diagnosis not present

## 2014-10-31 DIAGNOSIS — I110 Hypertensive heart disease with heart failure: Secondary | ICD-10-CM | POA: Diagnosis not present

## 2014-11-01 DIAGNOSIS — E1165 Type 2 diabetes mellitus with hyperglycemia: Secondary | ICD-10-CM | POA: Diagnosis not present

## 2014-11-01 DIAGNOSIS — N401 Enlarged prostate with lower urinary tract symptoms: Secondary | ICD-10-CM | POA: Diagnosis not present

## 2014-11-01 DIAGNOSIS — I5020 Unspecified systolic (congestive) heart failure: Secondary | ICD-10-CM | POA: Diagnosis not present

## 2014-11-01 DIAGNOSIS — N138 Other obstructive and reflux uropathy: Secondary | ICD-10-CM | POA: Diagnosis not present

## 2014-11-01 DIAGNOSIS — N3289 Other specified disorders of bladder: Secondary | ICD-10-CM | POA: Diagnosis not present

## 2014-11-01 DIAGNOSIS — R338 Other retention of urine: Secondary | ICD-10-CM | POA: Diagnosis not present

## 2014-11-01 DIAGNOSIS — S72141D Displaced intertrochanteric fracture of right femur, subsequent encounter for closed fracture with routine healing: Secondary | ICD-10-CM | POA: Diagnosis not present

## 2014-11-01 DIAGNOSIS — I110 Hypertensive heart disease with heart failure: Secondary | ICD-10-CM | POA: Diagnosis not present

## 2014-11-02 DIAGNOSIS — S72141D Displaced intertrochanteric fracture of right femur, subsequent encounter for closed fracture with routine healing: Secondary | ICD-10-CM | POA: Diagnosis not present

## 2014-11-02 DIAGNOSIS — E1165 Type 2 diabetes mellitus with hyperglycemia: Secondary | ICD-10-CM | POA: Diagnosis not present

## 2014-11-02 DIAGNOSIS — I110 Hypertensive heart disease with heart failure: Secondary | ICD-10-CM | POA: Diagnosis not present

## 2014-11-02 DIAGNOSIS — R338 Other retention of urine: Secondary | ICD-10-CM | POA: Diagnosis not present

## 2014-11-02 DIAGNOSIS — I5020 Unspecified systolic (congestive) heart failure: Secondary | ICD-10-CM | POA: Diagnosis not present

## 2014-11-02 DIAGNOSIS — N401 Enlarged prostate with lower urinary tract symptoms: Secondary | ICD-10-CM | POA: Diagnosis not present

## 2014-11-03 DIAGNOSIS — I5020 Unspecified systolic (congestive) heart failure: Secondary | ICD-10-CM | POA: Diagnosis not present

## 2014-11-03 DIAGNOSIS — E1165 Type 2 diabetes mellitus with hyperglycemia: Secondary | ICD-10-CM | POA: Diagnosis not present

## 2014-11-03 DIAGNOSIS — S72141D Displaced intertrochanteric fracture of right femur, subsequent encounter for closed fracture with routine healing: Secondary | ICD-10-CM | POA: Diagnosis not present

## 2014-11-03 DIAGNOSIS — R338 Other retention of urine: Secondary | ICD-10-CM | POA: Diagnosis not present

## 2014-11-03 DIAGNOSIS — N401 Enlarged prostate with lower urinary tract symptoms: Secondary | ICD-10-CM | POA: Diagnosis not present

## 2014-11-03 DIAGNOSIS — I110 Hypertensive heart disease with heart failure: Secondary | ICD-10-CM | POA: Diagnosis not present

## 2014-11-04 DIAGNOSIS — N401 Enlarged prostate with lower urinary tract symptoms: Secondary | ICD-10-CM | POA: Diagnosis not present

## 2014-11-04 DIAGNOSIS — I5020 Unspecified systolic (congestive) heart failure: Secondary | ICD-10-CM | POA: Diagnosis not present

## 2014-11-04 DIAGNOSIS — R338 Other retention of urine: Secondary | ICD-10-CM | POA: Diagnosis not present

## 2014-11-04 DIAGNOSIS — S72141D Displaced intertrochanteric fracture of right femur, subsequent encounter for closed fracture with routine healing: Secondary | ICD-10-CM | POA: Diagnosis not present

## 2014-11-04 DIAGNOSIS — I110 Hypertensive heart disease with heart failure: Secondary | ICD-10-CM | POA: Diagnosis not present

## 2014-11-04 DIAGNOSIS — E1165 Type 2 diabetes mellitus with hyperglycemia: Secondary | ICD-10-CM | POA: Diagnosis not present

## 2014-11-07 DIAGNOSIS — S72101D Unspecified trochanteric fracture of right femur, subsequent encounter for closed fracture with routine healing: Secondary | ICD-10-CM | POA: Diagnosis not present

## 2014-11-07 DIAGNOSIS — E1165 Type 2 diabetes mellitus with hyperglycemia: Secondary | ICD-10-CM | POA: Diagnosis not present

## 2014-11-07 DIAGNOSIS — I110 Hypertensive heart disease with heart failure: Secondary | ICD-10-CM | POA: Diagnosis not present

## 2014-11-07 DIAGNOSIS — M25551 Pain in right hip: Secondary | ICD-10-CM | POA: Diagnosis not present

## 2014-11-07 DIAGNOSIS — I5020 Unspecified systolic (congestive) heart failure: Secondary | ICD-10-CM | POA: Diagnosis not present

## 2014-11-07 DIAGNOSIS — R338 Other retention of urine: Secondary | ICD-10-CM | POA: Diagnosis not present

## 2014-11-07 DIAGNOSIS — N401 Enlarged prostate with lower urinary tract symptoms: Secondary | ICD-10-CM | POA: Diagnosis not present

## 2014-11-07 DIAGNOSIS — S72141D Displaced intertrochanteric fracture of right femur, subsequent encounter for closed fracture with routine healing: Secondary | ICD-10-CM | POA: Diagnosis not present

## 2014-11-07 DIAGNOSIS — Z9889 Other specified postprocedural states: Secondary | ICD-10-CM | POA: Diagnosis not present

## 2014-11-08 DIAGNOSIS — N401 Enlarged prostate with lower urinary tract symptoms: Secondary | ICD-10-CM | POA: Diagnosis not present

## 2014-11-08 DIAGNOSIS — E1165 Type 2 diabetes mellitus with hyperglycemia: Secondary | ICD-10-CM | POA: Diagnosis not present

## 2014-11-08 DIAGNOSIS — I110 Hypertensive heart disease with heart failure: Secondary | ICD-10-CM | POA: Diagnosis not present

## 2014-11-08 DIAGNOSIS — R338 Other retention of urine: Secondary | ICD-10-CM | POA: Diagnosis not present

## 2014-11-08 DIAGNOSIS — S72141D Displaced intertrochanteric fracture of right femur, subsequent encounter for closed fracture with routine healing: Secondary | ICD-10-CM | POA: Diagnosis not present

## 2014-11-08 DIAGNOSIS — I5020 Unspecified systolic (congestive) heart failure: Secondary | ICD-10-CM | POA: Diagnosis not present

## 2014-11-09 DIAGNOSIS — I110 Hypertensive heart disease with heart failure: Secondary | ICD-10-CM | POA: Diagnosis not present

## 2014-11-09 DIAGNOSIS — E1165 Type 2 diabetes mellitus with hyperglycemia: Secondary | ICD-10-CM | POA: Diagnosis not present

## 2014-11-09 DIAGNOSIS — I5020 Unspecified systolic (congestive) heart failure: Secondary | ICD-10-CM | POA: Diagnosis not present

## 2014-11-09 DIAGNOSIS — N401 Enlarged prostate with lower urinary tract symptoms: Secondary | ICD-10-CM | POA: Diagnosis not present

## 2014-11-09 DIAGNOSIS — R338 Other retention of urine: Secondary | ICD-10-CM | POA: Diagnosis not present

## 2014-11-09 DIAGNOSIS — S72141D Displaced intertrochanteric fracture of right femur, subsequent encounter for closed fracture with routine healing: Secondary | ICD-10-CM | POA: Diagnosis not present

## 2014-11-10 DIAGNOSIS — S72141D Displaced intertrochanteric fracture of right femur, subsequent encounter for closed fracture with routine healing: Secondary | ICD-10-CM | POA: Diagnosis not present

## 2014-11-10 DIAGNOSIS — I5020 Unspecified systolic (congestive) heart failure: Secondary | ICD-10-CM | POA: Diagnosis not present

## 2014-11-10 DIAGNOSIS — N401 Enlarged prostate with lower urinary tract symptoms: Secondary | ICD-10-CM | POA: Diagnosis not present

## 2014-11-10 DIAGNOSIS — I110 Hypertensive heart disease with heart failure: Secondary | ICD-10-CM | POA: Diagnosis not present

## 2014-11-10 DIAGNOSIS — R338 Other retention of urine: Secondary | ICD-10-CM | POA: Diagnosis not present

## 2014-11-10 DIAGNOSIS — E1165 Type 2 diabetes mellitus with hyperglycemia: Secondary | ICD-10-CM | POA: Diagnosis not present

## 2014-11-11 DIAGNOSIS — I5020 Unspecified systolic (congestive) heart failure: Secondary | ICD-10-CM | POA: Diagnosis not present

## 2014-11-11 DIAGNOSIS — I110 Hypertensive heart disease with heart failure: Secondary | ICD-10-CM | POA: Diagnosis not present

## 2014-11-11 DIAGNOSIS — R338 Other retention of urine: Secondary | ICD-10-CM | POA: Diagnosis not present

## 2014-11-11 DIAGNOSIS — E1165 Type 2 diabetes mellitus with hyperglycemia: Secondary | ICD-10-CM | POA: Diagnosis not present

## 2014-11-11 DIAGNOSIS — S72141D Displaced intertrochanteric fracture of right femur, subsequent encounter for closed fracture with routine healing: Secondary | ICD-10-CM | POA: Diagnosis not present

## 2014-11-11 DIAGNOSIS — N401 Enlarged prostate with lower urinary tract symptoms: Secondary | ICD-10-CM | POA: Diagnosis not present

## 2014-11-11 DIAGNOSIS — D509 Iron deficiency anemia, unspecified: Secondary | ICD-10-CM | POA: Diagnosis not present

## 2014-11-11 DIAGNOSIS — I10 Essential (primary) hypertension: Secondary | ICD-10-CM | POA: Diagnosis not present

## 2014-11-14 DIAGNOSIS — N401 Enlarged prostate with lower urinary tract symptoms: Secondary | ICD-10-CM | POA: Diagnosis not present

## 2014-11-14 DIAGNOSIS — I110 Hypertensive heart disease with heart failure: Secondary | ICD-10-CM | POA: Diagnosis not present

## 2014-11-14 DIAGNOSIS — E1165 Type 2 diabetes mellitus with hyperglycemia: Secondary | ICD-10-CM | POA: Diagnosis not present

## 2014-11-14 DIAGNOSIS — R338 Other retention of urine: Secondary | ICD-10-CM | POA: Diagnosis not present

## 2014-11-14 DIAGNOSIS — S72141D Displaced intertrochanteric fracture of right femur, subsequent encounter for closed fracture with routine healing: Secondary | ICD-10-CM | POA: Diagnosis not present

## 2014-11-14 DIAGNOSIS — I5020 Unspecified systolic (congestive) heart failure: Secondary | ICD-10-CM | POA: Diagnosis not present

## 2014-11-15 DIAGNOSIS — S72141D Displaced intertrochanteric fracture of right femur, subsequent encounter for closed fracture with routine healing: Secondary | ICD-10-CM | POA: Diagnosis not present

## 2014-11-15 DIAGNOSIS — E1165 Type 2 diabetes mellitus with hyperglycemia: Secondary | ICD-10-CM | POA: Diagnosis not present

## 2014-11-15 DIAGNOSIS — I5020 Unspecified systolic (congestive) heart failure: Secondary | ICD-10-CM | POA: Diagnosis not present

## 2014-11-15 DIAGNOSIS — R338 Other retention of urine: Secondary | ICD-10-CM | POA: Diagnosis not present

## 2014-11-15 DIAGNOSIS — I110 Hypertensive heart disease with heart failure: Secondary | ICD-10-CM | POA: Diagnosis not present

## 2014-11-15 DIAGNOSIS — N401 Enlarged prostate with lower urinary tract symptoms: Secondary | ICD-10-CM | POA: Diagnosis not present

## 2014-11-16 DIAGNOSIS — I5020 Unspecified systolic (congestive) heart failure: Secondary | ICD-10-CM | POA: Diagnosis not present

## 2014-11-16 DIAGNOSIS — N401 Enlarged prostate with lower urinary tract symptoms: Secondary | ICD-10-CM | POA: Diagnosis not present

## 2014-11-16 DIAGNOSIS — I110 Hypertensive heart disease with heart failure: Secondary | ICD-10-CM | POA: Diagnosis not present

## 2014-11-16 DIAGNOSIS — R338 Other retention of urine: Secondary | ICD-10-CM | POA: Diagnosis not present

## 2014-11-16 DIAGNOSIS — S72141D Displaced intertrochanteric fracture of right femur, subsequent encounter for closed fracture with routine healing: Secondary | ICD-10-CM | POA: Diagnosis not present

## 2014-11-16 DIAGNOSIS — E1165 Type 2 diabetes mellitus with hyperglycemia: Secondary | ICD-10-CM | POA: Diagnosis not present

## 2014-11-17 DIAGNOSIS — N401 Enlarged prostate with lower urinary tract symptoms: Secondary | ICD-10-CM | POA: Diagnosis not present

## 2014-11-17 DIAGNOSIS — I110 Hypertensive heart disease with heart failure: Secondary | ICD-10-CM | POA: Diagnosis not present

## 2014-11-17 DIAGNOSIS — S72141D Displaced intertrochanteric fracture of right femur, subsequent encounter for closed fracture with routine healing: Secondary | ICD-10-CM | POA: Diagnosis not present

## 2014-11-17 DIAGNOSIS — R972 Elevated prostate specific antigen [PSA]: Secondary | ICD-10-CM | POA: Diagnosis not present

## 2014-11-17 DIAGNOSIS — I5020 Unspecified systolic (congestive) heart failure: Secondary | ICD-10-CM | POA: Diagnosis not present

## 2014-11-17 DIAGNOSIS — R338 Other retention of urine: Secondary | ICD-10-CM | POA: Diagnosis not present

## 2014-11-17 DIAGNOSIS — E1165 Type 2 diabetes mellitus with hyperglycemia: Secondary | ICD-10-CM | POA: Diagnosis not present

## 2014-11-18 DIAGNOSIS — I110 Hypertensive heart disease with heart failure: Secondary | ICD-10-CM | POA: Diagnosis not present

## 2014-11-18 DIAGNOSIS — S72141D Displaced intertrochanteric fracture of right femur, subsequent encounter for closed fracture with routine healing: Secondary | ICD-10-CM | POA: Diagnosis not present

## 2014-11-18 DIAGNOSIS — E1165 Type 2 diabetes mellitus with hyperglycemia: Secondary | ICD-10-CM | POA: Diagnosis not present

## 2014-11-18 DIAGNOSIS — I5020 Unspecified systolic (congestive) heart failure: Secondary | ICD-10-CM | POA: Diagnosis not present

## 2014-11-18 DIAGNOSIS — N401 Enlarged prostate with lower urinary tract symptoms: Secondary | ICD-10-CM | POA: Diagnosis not present

## 2014-11-18 DIAGNOSIS — R338 Other retention of urine: Secondary | ICD-10-CM | POA: Diagnosis not present

## 2014-11-19 ENCOUNTER — Emergency Department (HOSPITAL_COMMUNITY)
Admission: EM | Admit: 2014-11-19 | Discharge: 2014-11-19 | Disposition: A | Discharge status: Home / Self Care | Payer: Medicare Other | Attending: Emergency Medicine | Admitting: Emergency Medicine

## 2014-11-19 ENCOUNTER — Encounter (HOSPITAL_COMMUNITY): Payer: Self-pay | Admitting: Emergency Medicine

## 2014-11-19 DIAGNOSIS — I10 Essential (primary) hypertension: Secondary | ICD-10-CM | POA: Insufficient documentation

## 2014-11-19 DIAGNOSIS — T839XXA Unspecified complication of genitourinary prosthetic device, implant and graft, initial encounter: Secondary | ICD-10-CM

## 2014-11-19 DIAGNOSIS — Y658 Other specified misadventures during surgical and medical care: Secondary | ICD-10-CM | POA: Insufficient documentation

## 2014-11-19 DIAGNOSIS — I5020 Unspecified systolic (congestive) heart failure: Secondary | ICD-10-CM | POA: Diagnosis not present

## 2014-11-19 DIAGNOSIS — Z8673 Personal history of transient ischemic attack (TIA), and cerebral infarction without residual deficits: Secondary | ICD-10-CM | POA: Insufficient documentation

## 2014-11-19 DIAGNOSIS — T83031A Leakage of indwelling urethral catheter, initial encounter: Secondary | ICD-10-CM | POA: Insufficient documentation

## 2014-11-19 DIAGNOSIS — I2510 Atherosclerotic heart disease of native coronary artery without angina pectoris: Secondary | ICD-10-CM | POA: Insufficient documentation

## 2014-11-19 DIAGNOSIS — Z8709 Personal history of other diseases of the respiratory system: Secondary | ICD-10-CM | POA: Diagnosis not present

## 2014-11-19 DIAGNOSIS — N390 Urinary tract infection, site not specified: Secondary | ICD-10-CM | POA: Diagnosis not present

## 2014-11-19 DIAGNOSIS — Z79899 Other long term (current) drug therapy: Secondary | ICD-10-CM | POA: Diagnosis not present

## 2014-11-19 DIAGNOSIS — T83098A Other mechanical complication of other indwelling urethral catheter, initial encounter: Secondary | ICD-10-CM | POA: Diagnosis not present

## 2014-11-19 DIAGNOSIS — N400 Enlarged prostate without lower urinary tract symptoms: Secondary | ICD-10-CM | POA: Diagnosis not present

## 2014-11-19 LAB — URINALYSIS, ROUTINE W REFLEX MICROSCOPIC
BILIRUBIN URINE: NEGATIVE
Glucose, UA: NEGATIVE mg/dL
KETONES UR: NEGATIVE mg/dL
NITRITE: NEGATIVE
PH: 6 (ref 5.0–8.0)
PROTEIN: 30 mg/dL — AB
Specific Gravity, Urine: 1.01 (ref 1.005–1.030)
UROBILINOGEN UA: 0.2 mg/dL (ref 0.0–1.0)

## 2014-11-19 LAB — URINE MICROSCOPIC-ADD ON

## 2014-11-19 MED ORDER — CIPROFLOXACIN HCL 500 MG PO TABS: 500.0000 mg | ORAL_TABLET | Freq: Two times a day (BID) | ORAL | Status: DC

## 2014-11-19 MED ORDER — CIPROFLOXACIN HCL 500 MG PO TABS
500.0000 mg | ORAL_TABLET | Freq: Once | ORAL | Status: AC
  Administered 2014-11-19: 500 mg via ORAL
  Filled 2014-11-19: qty 1

## 2014-11-19 NOTE — ED Notes (Signed)
Pt was able to urinate 75 ml into urinal. Pt bladder scan showed 178 ml in bladder. Dr Sabra Heck made aware of results

## 2014-11-19 NOTE — ED Notes (Signed)
Pt is being discharged home. Discharge instructions included patient follow up and what to do in case of emergency were given to the patient

## 2014-11-19 NOTE — ED Notes (Signed)
Pt from home c/o catheter not draining for approx 12 hrs. Pt reports that catheter was placed by urology approx 3 weeks ago for enlarged prostate. Pt sts that when he urinates, he is "peeing around the catheter." Pt is A&O and in NAD

## 2014-11-19 NOTE — ED Provider Notes (Signed)
CSN: [id number]     Arrival date & time 11/19/14  1000 History   First MD Initiated Contact with Patient 11/19/14 1026     Chief Complaint  Patient presents with  . Catheter not draining      (Consider location/radiation/quality/duration/timing/severity/associated sxs/prior Treatment) HPI Comments: The patient is an 81 year old male, he presents after having a catheter placed last month for urinary retention because of a large prostate. He states that his catheter has not been draining overnight. It has been leaking around the tip of the catheter at the urethral meatus. He has increased lower abdominal pain but no fevers or chills. The symptoms are persistent, gradually worsening and his discomfort is now severe. There is no vomiting  The history is provided by the patient.    Past Medical History  Diagnosis Date  . Benign essential HTN   . CAD (coronary artery disease)   . CVA (cerebral infarction)     1990  . BPH (benign prostatic hyperplasia)   . Urinary retention   . Systolic CHF ([place])   . Pleural effusion     Once in past S/P Thoracentesis   Past Surgical History  Procedure Laterality Date  . Total hip arthroplasty Right     09/19/2014  . Appendectomy     Family History  Problem Relation Age of Onset  . CAD Mother   . Diabetes Mother   . Diabetes Father    Social History  Substance Use Topics  . Smoking status: Never Smoker   . Smokeless tobacco: Never Used  . Alcohol Use: No    Review of Systems  All other systems reviewed and are negative.     Allergies  Percocet  Home Medications   Prior to Admission medications   Medication Sig Start Date End Date Taking? Authorizing Provider  acetaminophen (TYLENOL) 650 MG CR tablet Take 650 mg by mouth every 8 (eight) hours as needed for pain.   Yes Historical Provider, MD  ferrous sulfate 325 (65 FE) MG tablet Take 1 tablet (325 mg total) by mouth 2 (two) times daily with a meal. 10/21/14  Yes [name], MD   finasteride (PROSCAR) 5 MG tablet Take 1 tablet (5 mg total) by mouth daily. 10/21/14  Yes [name], MD  metoprolol tartrate (LOPRESSOR) 25 MG tablet Take 0.5 tablets (12.5 mg total) by mouth 2 (two) times daily. 10/21/14  Yes [name], MD  sitaGLIPtin (JANUVIA) 100 MG tablet Take 50 mg by mouth daily.   Yes Historical Provider, MD  tamsulosin (FLOMAX) 0.4 MG CAPS capsule Take 2 capsules (0.8 mg total) by mouth daily. 10/21/14  Yes [name], MD  ciprofloxacin (CIPRO) 500 MG tablet Take 1 tablet (500 mg total) by mouth every 12 (twelve) hours. 11/19/14   Noemi Chapel, MD  feeding supplement, GLUCERNA SHAKE, (GLUCERNA SHAKE) LIQD Take 237 mLs by mouth 3 (three) times daily between meals. Patient not taking: Reported on 11/19/2014 10/21/14   [name], MD   BP 140/84 mmHg  Pulse 90  Temp(Src) 97.9 F (36.6 C) (Oral)  Resp 16  SpO2 100% Physical Exam  Constitutional: He appears well-developed and well-nourished. No distress.  HENT:  Head: Normocephalic and atraumatic.  Mouth/Throat: Oropharynx is clear and moist. No oropharyngeal exudate.  Eyes: Conjunctivae and EOM are normal. Pupils are equal, round, and reactive to light. Right eye exhibits no discharge. Left eye exhibits no discharge. No scleral icterus.  Neck: Normal range of motion. Neck supple. No JVD present. No thyromegaly present.  Cardiovascular: Normal rate, regular rhythm, normal heart sounds and intact distal pulses.  Exam reveals no gallop and no friction rub.   No murmur heard. Pulmonary/Chest: Effort normal and breath sounds normal. No respiratory distress. He has no wheezes. He has no rales.  Abdominal: Soft. Bowel sounds are normal. He exhibits no distension and no mass. There is no tenderness.  Genitourinary:  Normal appearing circumcised penis, Foley catheter well-seated  Musculoskeletal: Normal range of motion. He exhibits no edema or tenderness.  Lymphadenopathy:    He has no cervical adenopathy.   Neurological: He is alert. Coordination normal.  Skin: Skin is warm and dry. No rash noted. No erythema.  Psychiatric: He has a normal mood and affect. His behavior is normal.  Nursing note and vitals reviewed.   ED Course  Procedures (including critical care time) Labs Review Labs Reviewed  URINALYSIS, ROUTINE W REFLEX MICROSCOPIC (NOT AT [facility]) - Abnormal; Notable for the following:    APPearance CLOUDY (*)    Hgb urine dipstick LARGE (*)    Protein, ur 30 (*)    Leukocytes, UA LARGE (*)    All other components within normal limits  URINE MICROSCOPIC-ADD ON - Abnormal; Notable for the following:    Bacteria, UA FEW (*)    Crystals CA OXALATE CRYSTALS (*)    All other components within normal limits  URINE CULTURE    Imaging Review No results found. I have personally reviewed and evaluated these images and lab results as part of my medical decision-making.    MDM   Final diagnoses:  Foley catheter problem, initial encounter ([place])  UTI (lower urinary tract infection)    The patient is in no distress, his Foley catheter is seated however there is no urine in the tubing or in the back, will remove the catheter and try a trial of spontaneous voiding. The patient requested this as he does not want to have the catheter anymore. He is willing to try having the catheter replaced if he cannot void. He is to follow-up with the urologist in 12 days (Dr. Karsten Ro)  Has been able to void > 100cc here, wants catheter out permanently - explained indications for return - UTI present - family and pt in agreement.  Meds given in ED:  Medications  ciprofloxacin (CIPRO) tablet 500 mg (not administered)    New Prescriptions   CIPROFLOXACIN (CIPRO) 500 MG TABLET    Take 1 tablet (500 mg total) by mouth every 12 (twelve) hours.      Noemi Chapel, MD 11/19/14 [phone]

## 2014-11-19 NOTE — Discharge Instructions (Signed)

## 2014-11-21 DIAGNOSIS — I5020 Unspecified systolic (congestive) heart failure: Secondary | ICD-10-CM | POA: Diagnosis not present

## 2014-11-21 DIAGNOSIS — S72141D Displaced intertrochanteric fracture of right femur, subsequent encounter for closed fracture with routine healing: Secondary | ICD-10-CM | POA: Diagnosis not present

## 2014-11-21 DIAGNOSIS — I110 Hypertensive heart disease with heart failure: Secondary | ICD-10-CM | POA: Diagnosis not present

## 2014-11-21 DIAGNOSIS — R338 Other retention of urine: Secondary | ICD-10-CM | POA: Diagnosis not present

## 2014-11-21 DIAGNOSIS — E1165 Type 2 diabetes mellitus with hyperglycemia: Secondary | ICD-10-CM | POA: Diagnosis not present

## 2014-11-21 DIAGNOSIS — N401 Enlarged prostate with lower urinary tract symptoms: Secondary | ICD-10-CM | POA: Diagnosis not present

## 2014-11-21 LAB — URINE CULTURE
Culture: >=100000
SPECIAL REQUESTS: NORMAL

## 2014-11-22 ENCOUNTER — Telehealth (HOSPITAL_BASED_OUTPATIENT_CLINIC_OR_DEPARTMENT_OTHER): Payer: Self-pay | Admitting: Emergency Medicine

## 2014-11-22 DIAGNOSIS — N401 Enlarged prostate with lower urinary tract symptoms: Secondary | ICD-10-CM | POA: Diagnosis not present

## 2014-11-22 DIAGNOSIS — I5020 Unspecified systolic (congestive) heart failure: Secondary | ICD-10-CM | POA: Diagnosis not present

## 2014-11-22 DIAGNOSIS — S72141D Displaced intertrochanteric fracture of right femur, subsequent encounter for closed fracture with routine healing: Secondary | ICD-10-CM | POA: Diagnosis not present

## 2014-11-22 DIAGNOSIS — E1165 Type 2 diabetes mellitus with hyperglycemia: Secondary | ICD-10-CM | POA: Diagnosis not present

## 2014-11-22 DIAGNOSIS — R338 Other retention of urine: Secondary | ICD-10-CM | POA: Diagnosis not present

## 2014-11-22 DIAGNOSIS — I110 Hypertensive heart disease with heart failure: Secondary | ICD-10-CM | POA: Diagnosis not present

## 2014-11-22 NOTE — Telephone Encounter (Signed)
Post ED Visit - Positive Culture Follow-up  Culture report reviewed by antimicrobial stewardship pharmacist:  []  Elenor Quinones, Pharm.D. []  Heide Guile, Pharm.D., BCPS []  Parks Neptune, Pharm.D. []  Alycia Rossetti, Pharm.D., BCPS []  [place], Pharm.D., BCPS, AAHIVP []  Legrand Como, Pharm.D., BCPS, AAHIVP []  Milus Glazier, Pharm.D. [x]  Stephens November, [place].D.  Positive urine culture   Enterococcus Treated with ciprofloxacin, organism sensitive to the same and no further patient follow-up is required at this time.  Hazle Nordmann 11/22/2014, 9:03 AM

## 2014-11-23 DIAGNOSIS — N401 Enlarged prostate with lower urinary tract symptoms: Secondary | ICD-10-CM | POA: Diagnosis not present

## 2014-11-23 DIAGNOSIS — E1165 Type 2 diabetes mellitus with hyperglycemia: Secondary | ICD-10-CM | POA: Diagnosis not present

## 2014-11-23 DIAGNOSIS — S72141D Displaced intertrochanteric fracture of right femur, subsequent encounter for closed fracture with routine healing: Secondary | ICD-10-CM | POA: Diagnosis not present

## 2014-11-23 DIAGNOSIS — R338 Other retention of urine: Secondary | ICD-10-CM | POA: Diagnosis not present

## 2014-11-23 DIAGNOSIS — I5020 Unspecified systolic (congestive) heart failure: Secondary | ICD-10-CM | POA: Diagnosis not present

## 2014-11-23 DIAGNOSIS — I110 Hypertensive heart disease with heart failure: Secondary | ICD-10-CM | POA: Diagnosis not present

## 2014-11-24 DIAGNOSIS — I110 Hypertensive heart disease with heart failure: Secondary | ICD-10-CM | POA: Diagnosis not present

## 2014-11-24 DIAGNOSIS — S72141D Displaced intertrochanteric fracture of right femur, subsequent encounter for closed fracture with routine healing: Secondary | ICD-10-CM | POA: Diagnosis not present

## 2014-11-24 DIAGNOSIS — E1165 Type 2 diabetes mellitus with hyperglycemia: Secondary | ICD-10-CM | POA: Diagnosis not present

## 2014-11-24 DIAGNOSIS — R338 Other retention of urine: Secondary | ICD-10-CM | POA: Diagnosis not present

## 2014-11-24 DIAGNOSIS — N401 Enlarged prostate with lower urinary tract symptoms: Secondary | ICD-10-CM | POA: Diagnosis not present

## 2014-11-24 DIAGNOSIS — I5020 Unspecified systolic (congestive) heart failure: Secondary | ICD-10-CM | POA: Diagnosis not present

## 2014-11-28 DIAGNOSIS — S72141D Displaced intertrochanteric fracture of right femur, subsequent encounter for closed fracture with routine healing: Secondary | ICD-10-CM | POA: Diagnosis not present

## 2014-11-28 DIAGNOSIS — I5020 Unspecified systolic (congestive) heart failure: Secondary | ICD-10-CM | POA: Diagnosis not present

## 2014-11-28 DIAGNOSIS — I110 Hypertensive heart disease with heart failure: Secondary | ICD-10-CM | POA: Diagnosis not present

## 2014-11-28 DIAGNOSIS — N401 Enlarged prostate with lower urinary tract symptoms: Secondary | ICD-10-CM | POA: Diagnosis not present

## 2014-11-28 DIAGNOSIS — R338 Other retention of urine: Secondary | ICD-10-CM | POA: Diagnosis not present

## 2014-11-28 DIAGNOSIS — E1165 Type 2 diabetes mellitus with hyperglycemia: Secondary | ICD-10-CM | POA: Diagnosis not present

## 2014-11-29 DIAGNOSIS — N401 Enlarged prostate with lower urinary tract symptoms: Secondary | ICD-10-CM | POA: Diagnosis not present

## 2014-11-29 DIAGNOSIS — R338 Other retention of urine: Secondary | ICD-10-CM | POA: Diagnosis not present

## 2014-11-29 DIAGNOSIS — I110 Hypertensive heart disease with heart failure: Secondary | ICD-10-CM | POA: Diagnosis not present

## 2014-11-29 DIAGNOSIS — S72141D Displaced intertrochanteric fracture of right femur, subsequent encounter for closed fracture with routine healing: Secondary | ICD-10-CM | POA: Diagnosis not present

## 2014-11-29 DIAGNOSIS — E1165 Type 2 diabetes mellitus with hyperglycemia: Secondary | ICD-10-CM | POA: Diagnosis not present

## 2014-11-29 DIAGNOSIS — I5020 Unspecified systolic (congestive) heart failure: Secondary | ICD-10-CM | POA: Diagnosis not present

## 2014-11-30 DIAGNOSIS — E1165 Type 2 diabetes mellitus with hyperglycemia: Secondary | ICD-10-CM | POA: Diagnosis not present

## 2014-11-30 DIAGNOSIS — R338 Other retention of urine: Secondary | ICD-10-CM | POA: Diagnosis not present

## 2014-11-30 DIAGNOSIS — S72141D Displaced intertrochanteric fracture of right femur, subsequent encounter for closed fracture with routine healing: Secondary | ICD-10-CM | POA: Diagnosis not present

## 2014-11-30 DIAGNOSIS — I5020 Unspecified systolic (congestive) heart failure: Secondary | ICD-10-CM | POA: Diagnosis not present

## 2014-11-30 DIAGNOSIS — I110 Hypertensive heart disease with heart failure: Secondary | ICD-10-CM | POA: Diagnosis not present

## 2014-11-30 DIAGNOSIS — N401 Enlarged prostate with lower urinary tract symptoms: Secondary | ICD-10-CM | POA: Diagnosis not present

## 2014-12-01 DIAGNOSIS — R3914 Feeling of incomplete bladder emptying: Secondary | ICD-10-CM | POA: Diagnosis not present

## 2014-12-01 DIAGNOSIS — R972 Elevated prostate specific antigen [PSA]: Secondary | ICD-10-CM | POA: Diagnosis not present

## 2014-12-01 DIAGNOSIS — N201 Calculus of ureter: Secondary | ICD-10-CM | POA: Diagnosis not present

## 2014-12-01 DIAGNOSIS — N401 Enlarged prostate with lower urinary tract symptoms: Secondary | ICD-10-CM | POA: Diagnosis not present

## 2014-12-02 DIAGNOSIS — M170 Bilateral primary osteoarthritis of knee: Secondary | ICD-10-CM | POA: Diagnosis not present

## 2014-12-02 DIAGNOSIS — M179 Osteoarthritis of knee, unspecified: Secondary | ICD-10-CM | POA: Diagnosis not present

## 2014-12-02 DIAGNOSIS — S72101D Unspecified trochanteric fracture of right femur, subsequent encounter for closed fracture with routine healing: Secondary | ICD-10-CM | POA: Diagnosis not present

## 2014-12-05 DIAGNOSIS — I2510 Atherosclerotic heart disease of native coronary artery without angina pectoris: Secondary | ICD-10-CM | POA: Diagnosis not present

## 2014-12-05 DIAGNOSIS — I10 Essential (primary) hypertension: Secondary | ICD-10-CM | POA: Diagnosis not present

## 2014-12-16 DIAGNOSIS — I639 Cerebral infarction, unspecified: Secondary | ICD-10-CM | POA: Diagnosis not present

## 2014-12-16 DIAGNOSIS — R2689 Other abnormalities of gait and mobility: Secondary | ICD-10-CM | POA: Diagnosis not present

## 2014-12-16 DIAGNOSIS — M6281 Muscle weakness (generalized): Secondary | ICD-10-CM | POA: Diagnosis not present

## 2014-12-16 DIAGNOSIS — R2681 Unsteadiness on feet: Secondary | ICD-10-CM | POA: Diagnosis not present

## 2014-12-20 DIAGNOSIS — R2689 Other abnormalities of gait and mobility: Secondary | ICD-10-CM | POA: Diagnosis not present

## 2014-12-20 DIAGNOSIS — R2681 Unsteadiness on feet: Secondary | ICD-10-CM | POA: Diagnosis not present

## 2014-12-20 DIAGNOSIS — I639 Cerebral infarction, unspecified: Secondary | ICD-10-CM | POA: Diagnosis not present

## 2014-12-20 DIAGNOSIS — M6281 Muscle weakness (generalized): Secondary | ICD-10-CM | POA: Diagnosis not present

## 2014-12-22 DIAGNOSIS — M6281 Muscle weakness (generalized): Secondary | ICD-10-CM | POA: Diagnosis not present

## 2014-12-22 DIAGNOSIS — R2689 Other abnormalities of gait and mobility: Secondary | ICD-10-CM | POA: Diagnosis not present

## 2014-12-22 DIAGNOSIS — R2681 Unsteadiness on feet: Secondary | ICD-10-CM | POA: Diagnosis not present

## 2014-12-22 DIAGNOSIS — I639 Cerebral infarction, unspecified: Secondary | ICD-10-CM | POA: Diagnosis not present

## 2014-12-23 DIAGNOSIS — E119 Type 2 diabetes mellitus without complications: Secondary | ICD-10-CM | POA: Diagnosis not present

## 2014-12-23 DIAGNOSIS — D509 Iron deficiency anemia, unspecified: Secondary | ICD-10-CM | POA: Diagnosis not present

## 2014-12-23 DIAGNOSIS — R0689 Other abnormalities of breathing: Secondary | ICD-10-CM | POA: Diagnosis not present

## 2014-12-23 DIAGNOSIS — R0600 Dyspnea, unspecified: Secondary | ICD-10-CM | POA: Diagnosis not present

## 2014-12-23 DIAGNOSIS — I10 Essential (primary) hypertension: Secondary | ICD-10-CM | POA: Diagnosis not present

## 2014-12-27 DIAGNOSIS — M6281 Muscle weakness (generalized): Secondary | ICD-10-CM | POA: Diagnosis not present

## 2014-12-27 DIAGNOSIS — R2681 Unsteadiness on feet: Secondary | ICD-10-CM | POA: Diagnosis not present

## 2014-12-27 DIAGNOSIS — R2689 Other abnormalities of gait and mobility: Secondary | ICD-10-CM | POA: Diagnosis not present

## 2014-12-27 DIAGNOSIS — I639 Cerebral infarction, unspecified: Secondary | ICD-10-CM | POA: Diagnosis not present

## 2014-12-29 DIAGNOSIS — R2689 Other abnormalities of gait and mobility: Secondary | ICD-10-CM | POA: Diagnosis not present

## 2014-12-29 DIAGNOSIS — I639 Cerebral infarction, unspecified: Secondary | ICD-10-CM | POA: Diagnosis not present

## 2014-12-29 DIAGNOSIS — M6281 Muscle weakness (generalized): Secondary | ICD-10-CM | POA: Diagnosis not present

## 2014-12-29 DIAGNOSIS — R2681 Unsteadiness on feet: Secondary | ICD-10-CM | POA: Diagnosis not present

## 2015-01-02 DIAGNOSIS — I639 Cerebral infarction, unspecified: Secondary | ICD-10-CM | POA: Diagnosis not present

## 2015-01-02 DIAGNOSIS — R2681 Unsteadiness on feet: Secondary | ICD-10-CM | POA: Diagnosis not present

## 2015-01-02 DIAGNOSIS — M6281 Muscle weakness (generalized): Secondary | ICD-10-CM | POA: Diagnosis not present

## 2015-01-02 DIAGNOSIS — R2689 Other abnormalities of gait and mobility: Secondary | ICD-10-CM | POA: Diagnosis not present

## 2015-01-04 DIAGNOSIS — R2681 Unsteadiness on feet: Secondary | ICD-10-CM | POA: Diagnosis not present

## 2015-01-04 DIAGNOSIS — I639 Cerebral infarction, unspecified: Secondary | ICD-10-CM | POA: Diagnosis not present

## 2015-01-04 DIAGNOSIS — R2689 Other abnormalities of gait and mobility: Secondary | ICD-10-CM | POA: Diagnosis not present

## 2015-01-04 DIAGNOSIS — M6281 Muscle weakness (generalized): Secondary | ICD-10-CM | POA: Diagnosis not present

## 2015-01-10 DIAGNOSIS — M6281 Muscle weakness (generalized): Secondary | ICD-10-CM | POA: Diagnosis not present

## 2015-01-10 DIAGNOSIS — R2681 Unsteadiness on feet: Secondary | ICD-10-CM | POA: Diagnosis not present

## 2015-01-10 DIAGNOSIS — R2689 Other abnormalities of gait and mobility: Secondary | ICD-10-CM | POA: Diagnosis not present

## 2015-01-10 DIAGNOSIS — I639 Cerebral infarction, unspecified: Secondary | ICD-10-CM | POA: Diagnosis not present

## 2015-01-12 DIAGNOSIS — M6281 Muscle weakness (generalized): Secondary | ICD-10-CM | POA: Diagnosis not present

## 2015-01-12 DIAGNOSIS — I639 Cerebral infarction, unspecified: Secondary | ICD-10-CM | POA: Diagnosis not present

## 2015-01-12 DIAGNOSIS — R2681 Unsteadiness on feet: Secondary | ICD-10-CM | POA: Diagnosis not present

## 2015-01-12 DIAGNOSIS — R2689 Other abnormalities of gait and mobility: Secondary | ICD-10-CM | POA: Diagnosis not present

## 2015-01-17 DIAGNOSIS — I639 Cerebral infarction, unspecified: Secondary | ICD-10-CM | POA: Diagnosis not present

## 2015-01-17 DIAGNOSIS — R2681 Unsteadiness on feet: Secondary | ICD-10-CM | POA: Diagnosis not present

## 2015-01-17 DIAGNOSIS — R2689 Other abnormalities of gait and mobility: Secondary | ICD-10-CM | POA: Diagnosis not present

## 2015-01-17 DIAGNOSIS — M6281 Muscle weakness (generalized): Secondary | ICD-10-CM | POA: Diagnosis not present

## 2015-01-19 DIAGNOSIS — M6281 Muscle weakness (generalized): Secondary | ICD-10-CM | POA: Diagnosis not present

## 2015-01-19 DIAGNOSIS — R2689 Other abnormalities of gait and mobility: Secondary | ICD-10-CM | POA: Diagnosis not present

## 2015-01-19 DIAGNOSIS — I639 Cerebral infarction, unspecified: Secondary | ICD-10-CM | POA: Diagnosis not present

## 2015-01-19 DIAGNOSIS — R2681 Unsteadiness on feet: Secondary | ICD-10-CM | POA: Diagnosis not present

## 2015-01-24 DIAGNOSIS — R21 Rash and other nonspecific skin eruption: Secondary | ICD-10-CM | POA: Diagnosis not present

## 2015-01-24 DIAGNOSIS — I10 Essential (primary) hypertension: Secondary | ICD-10-CM | POA: Diagnosis not present

## 2015-01-26 DIAGNOSIS — I639 Cerebral infarction, unspecified: Secondary | ICD-10-CM | POA: Diagnosis not present

## 2015-01-26 DIAGNOSIS — M6281 Muscle weakness (generalized): Secondary | ICD-10-CM | POA: Diagnosis not present

## 2015-01-26 DIAGNOSIS — R2689 Other abnormalities of gait and mobility: Secondary | ICD-10-CM | POA: Diagnosis not present

## 2015-01-26 DIAGNOSIS — R2681 Unsteadiness on feet: Secondary | ICD-10-CM | POA: Diagnosis not present

## 2015-01-31 DIAGNOSIS — R2681 Unsteadiness on feet: Secondary | ICD-10-CM | POA: Diagnosis not present

## 2015-01-31 DIAGNOSIS — R2689 Other abnormalities of gait and mobility: Secondary | ICD-10-CM | POA: Diagnosis not present

## 2015-01-31 DIAGNOSIS — M6281 Muscle weakness (generalized): Secondary | ICD-10-CM | POA: Diagnosis not present

## 2015-01-31 DIAGNOSIS — I639 Cerebral infarction, unspecified: Secondary | ICD-10-CM | POA: Diagnosis not present

## 2015-02-06 DIAGNOSIS — R2681 Unsteadiness on feet: Secondary | ICD-10-CM | POA: Diagnosis not present

## 2015-02-06 DIAGNOSIS — I639 Cerebral infarction, unspecified: Secondary | ICD-10-CM | POA: Diagnosis not present

## 2015-02-06 DIAGNOSIS — R2689 Other abnormalities of gait and mobility: Secondary | ICD-10-CM | POA: Diagnosis not present

## 2015-02-06 DIAGNOSIS — M6281 Muscle weakness (generalized): Secondary | ICD-10-CM | POA: Diagnosis not present

## 2015-02-09 DIAGNOSIS — R2689 Other abnormalities of gait and mobility: Secondary | ICD-10-CM | POA: Diagnosis not present

## 2015-02-09 DIAGNOSIS — M6281 Muscle weakness (generalized): Secondary | ICD-10-CM | POA: Diagnosis not present

## 2015-02-09 DIAGNOSIS — R2681 Unsteadiness on feet: Secondary | ICD-10-CM | POA: Diagnosis not present

## 2015-02-09 DIAGNOSIS — I639 Cerebral infarction, unspecified: Secondary | ICD-10-CM | POA: Diagnosis not present

## 2015-02-13 DIAGNOSIS — M6281 Muscle weakness (generalized): Secondary | ICD-10-CM | POA: Diagnosis not present

## 2015-02-13 DIAGNOSIS — I639 Cerebral infarction, unspecified: Secondary | ICD-10-CM | POA: Diagnosis not present

## 2015-02-13 DIAGNOSIS — R2689 Other abnormalities of gait and mobility: Secondary | ICD-10-CM | POA: Diagnosis not present

## 2015-02-13 DIAGNOSIS — R2681 Unsteadiness on feet: Secondary | ICD-10-CM | POA: Diagnosis not present

## 2015-02-16 DIAGNOSIS — M6281 Muscle weakness (generalized): Secondary | ICD-10-CM | POA: Diagnosis not present

## 2015-02-16 DIAGNOSIS — R2689 Other abnormalities of gait and mobility: Secondary | ICD-10-CM | POA: Diagnosis not present

## 2015-02-16 DIAGNOSIS — R2681 Unsteadiness on feet: Secondary | ICD-10-CM | POA: Diagnosis not present

## 2015-02-16 DIAGNOSIS — I639 Cerebral infarction, unspecified: Secondary | ICD-10-CM | POA: Diagnosis not present

## 2015-02-21 DIAGNOSIS — R2681 Unsteadiness on feet: Secondary | ICD-10-CM | POA: Diagnosis not present

## 2015-02-21 DIAGNOSIS — R2689 Other abnormalities of gait and mobility: Secondary | ICD-10-CM | POA: Diagnosis not present

## 2015-02-21 DIAGNOSIS — I639 Cerebral infarction, unspecified: Secondary | ICD-10-CM | POA: Diagnosis not present

## 2015-02-21 DIAGNOSIS — M6281 Muscle weakness (generalized): Secondary | ICD-10-CM | POA: Diagnosis not present

## 2015-02-22 DIAGNOSIS — D509 Iron deficiency anemia, unspecified: Secondary | ICD-10-CM | POA: Diagnosis not present

## 2015-02-22 DIAGNOSIS — I10 Essential (primary) hypertension: Secondary | ICD-10-CM | POA: Diagnosis not present

## 2015-02-22 DIAGNOSIS — E1165 Type 2 diabetes mellitus with hyperglycemia: Secondary | ICD-10-CM | POA: Diagnosis not present

## 2015-02-23 DIAGNOSIS — M6281 Muscle weakness (generalized): Secondary | ICD-10-CM | POA: Diagnosis not present

## 2015-02-23 DIAGNOSIS — I639 Cerebral infarction, unspecified: Secondary | ICD-10-CM | POA: Diagnosis not present

## 2015-02-23 DIAGNOSIS — R2689 Other abnormalities of gait and mobility: Secondary | ICD-10-CM | POA: Diagnosis not present

## 2015-02-23 DIAGNOSIS — R2681 Unsteadiness on feet: Secondary | ICD-10-CM | POA: Diagnosis not present

## 2015-02-28 DIAGNOSIS — I639 Cerebral infarction, unspecified: Secondary | ICD-10-CM | POA: Diagnosis not present

## 2015-02-28 DIAGNOSIS — R2681 Unsteadiness on feet: Secondary | ICD-10-CM | POA: Diagnosis not present

## 2015-02-28 DIAGNOSIS — R2689 Other abnormalities of gait and mobility: Secondary | ICD-10-CM | POA: Diagnosis not present

## 2015-02-28 DIAGNOSIS — M6281 Muscle weakness (generalized): Secondary | ICD-10-CM | POA: Diagnosis not present

## 2015-03-02 DIAGNOSIS — R2689 Other abnormalities of gait and mobility: Secondary | ICD-10-CM | POA: Diagnosis not present

## 2015-03-02 DIAGNOSIS — R2681 Unsteadiness on feet: Secondary | ICD-10-CM | POA: Diagnosis not present

## 2015-03-02 DIAGNOSIS — M6281 Muscle weakness (generalized): Secondary | ICD-10-CM | POA: Diagnosis not present

## 2015-03-02 DIAGNOSIS — I639 Cerebral infarction, unspecified: Secondary | ICD-10-CM | POA: Diagnosis not present

## 2015-03-07 ENCOUNTER — Other Ambulatory Visit: Payer: Self-pay | Admitting: Urology

## 2015-03-07 DIAGNOSIS — N201 Calculus of ureter: Secondary | ICD-10-CM | POA: Diagnosis not present

## 2015-03-07 DIAGNOSIS — Z0000 Encounter for general adult medical examination without abnormal findings: Secondary | ICD-10-CM | POA: Diagnosis not present

## 2015-03-07 DIAGNOSIS — R2689 Other abnormalities of gait and mobility: Secondary | ICD-10-CM | POA: Diagnosis not present

## 2015-03-07 DIAGNOSIS — R2681 Unsteadiness on feet: Secondary | ICD-10-CM | POA: Diagnosis not present

## 2015-03-07 DIAGNOSIS — M6281 Muscle weakness (generalized): Secondary | ICD-10-CM | POA: Diagnosis not present

## 2015-03-07 DIAGNOSIS — R3914 Feeling of incomplete bladder emptying: Secondary | ICD-10-CM | POA: Diagnosis not present

## 2015-03-07 DIAGNOSIS — I639 Cerebral infarction, unspecified: Secondary | ICD-10-CM | POA: Diagnosis not present

## 2015-03-07 DIAGNOSIS — N401 Enlarged prostate with lower urinary tract symptoms: Secondary | ICD-10-CM | POA: Diagnosis not present

## 2015-03-07 DIAGNOSIS — N390 Urinary tract infection, site not specified: Secondary | ICD-10-CM | POA: Diagnosis not present

## 2015-03-09 ENCOUNTER — Other Ambulatory Visit: Payer: Self-pay

## 2015-03-09 NOTE — Patient Outreach (Signed)
[facility]) Care Management  03/09/2015  Travis Costa [birth year] [id number]   SUBJECTIVE: Telephone call to patient regarding [facility]EXT[facility] high risk referral. HIPAA verified with patient.  Discussed and offered [facility] Care management services.  Patient denies services. Patient states he has to much going on right now.  Patient requested information regarding [facility] care management services.  Patient denies receiving [facility] care management material in the mail.  ASSESSMENT:  NEXT GEN tier 4 high risk referral  PLAN: RNCM will refer patient to Josepha Pigg to close due to refusal of services.  RNCM notified primary MD's referral coordinator, Mardene Celeste of patients refusal of services. Requested assistance with engagement to [facility] care management services   Quinn Plowman RN,BSN,CCM [facility] Telephonic  [phone]

## 2015-03-10 DIAGNOSIS — R2681 Unsteadiness on feet: Secondary | ICD-10-CM | POA: Diagnosis not present

## 2015-03-10 DIAGNOSIS — I639 Cerebral infarction, unspecified: Secondary | ICD-10-CM | POA: Diagnosis not present

## 2015-03-10 DIAGNOSIS — M6281 Muscle weakness (generalized): Secondary | ICD-10-CM | POA: Diagnosis not present

## 2015-03-10 DIAGNOSIS — R2689 Other abnormalities of gait and mobility: Secondary | ICD-10-CM | POA: Diagnosis not present

## 2015-03-14 DIAGNOSIS — I639 Cerebral infarction, unspecified: Secondary | ICD-10-CM | POA: Diagnosis not present

## 2015-03-14 DIAGNOSIS — M6281 Muscle weakness (generalized): Secondary | ICD-10-CM | POA: Diagnosis not present

## 2015-03-14 DIAGNOSIS — R2681 Unsteadiness on feet: Secondary | ICD-10-CM | POA: Diagnosis not present

## 2015-03-14 DIAGNOSIS — R2689 Other abnormalities of gait and mobility: Secondary | ICD-10-CM | POA: Diagnosis not present

## 2015-03-16 DIAGNOSIS — R2681 Unsteadiness on feet: Secondary | ICD-10-CM | POA: Diagnosis not present

## 2015-03-16 DIAGNOSIS — M6281 Muscle weakness (generalized): Secondary | ICD-10-CM | POA: Diagnosis not present

## 2015-03-16 DIAGNOSIS — I639 Cerebral infarction, unspecified: Secondary | ICD-10-CM | POA: Diagnosis not present

## 2015-03-16 DIAGNOSIS — R2689 Other abnormalities of gait and mobility: Secondary | ICD-10-CM | POA: Diagnosis not present

## 2015-03-21 DIAGNOSIS — N390 Urinary tract infection, site not specified: Secondary | ICD-10-CM | POA: Diagnosis not present

## 2015-03-21 DIAGNOSIS — R2689 Other abnormalities of gait and mobility: Secondary | ICD-10-CM | POA: Diagnosis not present

## 2015-03-21 DIAGNOSIS — M6281 Muscle weakness (generalized): Secondary | ICD-10-CM | POA: Diagnosis not present

## 2015-03-21 DIAGNOSIS — N200 Calculus of kidney: Secondary | ICD-10-CM | POA: Diagnosis not present

## 2015-03-21 DIAGNOSIS — I639 Cerebral infarction, unspecified: Secondary | ICD-10-CM | POA: Diagnosis not present

## 2015-03-21 DIAGNOSIS — R2681 Unsteadiness on feet: Secondary | ICD-10-CM | POA: Diagnosis not present

## 2015-03-23 DIAGNOSIS — M6281 Muscle weakness (generalized): Secondary | ICD-10-CM | POA: Diagnosis not present

## 2015-03-23 DIAGNOSIS — R2689 Other abnormalities of gait and mobility: Secondary | ICD-10-CM | POA: Diagnosis not present

## 2015-03-23 DIAGNOSIS — R2681 Unsteadiness on feet: Secondary | ICD-10-CM | POA: Diagnosis not present

## 2015-03-23 DIAGNOSIS — I639 Cerebral infarction, unspecified: Secondary | ICD-10-CM | POA: Diagnosis not present

## 2015-03-28 DIAGNOSIS — I639 Cerebral infarction, unspecified: Secondary | ICD-10-CM | POA: Diagnosis not present

## 2015-03-28 DIAGNOSIS — R2689 Other abnormalities of gait and mobility: Secondary | ICD-10-CM | POA: Diagnosis not present

## 2015-03-28 DIAGNOSIS — R2681 Unsteadiness on feet: Secondary | ICD-10-CM | POA: Diagnosis not present

## 2015-03-28 DIAGNOSIS — M6281 Muscle weakness (generalized): Secondary | ICD-10-CM | POA: Diagnosis not present

## 2015-03-30 DIAGNOSIS — M6281 Muscle weakness (generalized): Secondary | ICD-10-CM | POA: Diagnosis not present

## 2015-03-30 DIAGNOSIS — R2689 Other abnormalities of gait and mobility: Secondary | ICD-10-CM | POA: Diagnosis not present

## 2015-03-30 DIAGNOSIS — I639 Cerebral infarction, unspecified: Secondary | ICD-10-CM | POA: Diagnosis not present

## 2015-03-30 DIAGNOSIS — R2681 Unsteadiness on feet: Secondary | ICD-10-CM | POA: Diagnosis not present

## 2015-04-03 DIAGNOSIS — I639 Cerebral infarction, unspecified: Secondary | ICD-10-CM | POA: Diagnosis not present

## 2015-04-03 DIAGNOSIS — R2689 Other abnormalities of gait and mobility: Secondary | ICD-10-CM | POA: Diagnosis not present

## 2015-04-03 DIAGNOSIS — M6281 Muscle weakness (generalized): Secondary | ICD-10-CM | POA: Diagnosis not present

## 2015-04-03 DIAGNOSIS — R2681 Unsteadiness on feet: Secondary | ICD-10-CM | POA: Diagnosis not present

## 2015-04-05 DIAGNOSIS — M6281 Muscle weakness (generalized): Secondary | ICD-10-CM | POA: Diagnosis not present

## 2015-04-05 DIAGNOSIS — I639 Cerebral infarction, unspecified: Secondary | ICD-10-CM | POA: Diagnosis not present

## 2015-04-05 DIAGNOSIS — R2689 Other abnormalities of gait and mobility: Secondary | ICD-10-CM | POA: Diagnosis not present

## 2015-04-05 DIAGNOSIS — R2681 Unsteadiness on feet: Secondary | ICD-10-CM | POA: Diagnosis not present

## 2015-04-11 DIAGNOSIS — M6281 Muscle weakness (generalized): Secondary | ICD-10-CM | POA: Diagnosis not present

## 2015-04-11 DIAGNOSIS — R2689 Other abnormalities of gait and mobility: Secondary | ICD-10-CM | POA: Diagnosis not present

## 2015-04-11 DIAGNOSIS — R2681 Unsteadiness on feet: Secondary | ICD-10-CM | POA: Diagnosis not present

## 2015-04-11 DIAGNOSIS — I639 Cerebral infarction, unspecified: Secondary | ICD-10-CM | POA: Diagnosis not present

## 2015-04-13 DIAGNOSIS — H401231 Low-tension glaucoma, bilateral, mild stage: Secondary | ICD-10-CM | POA: Diagnosis not present

## 2015-04-14 ENCOUNTER — Encounter (HOSPITAL_COMMUNITY): Payer: Self-pay | Admitting: *Deleted

## 2015-04-14 DIAGNOSIS — I639 Cerebral infarction, unspecified: Secondary | ICD-10-CM | POA: Diagnosis not present

## 2015-04-14 DIAGNOSIS — R2681 Unsteadiness on feet: Secondary | ICD-10-CM | POA: Diagnosis not present

## 2015-04-14 DIAGNOSIS — M6281 Muscle weakness (generalized): Secondary | ICD-10-CM | POA: Diagnosis not present

## 2015-04-14 DIAGNOSIS — R2689 Other abnormalities of gait and mobility: Secondary | ICD-10-CM | POA: Diagnosis not present

## 2015-04-17 DIAGNOSIS — N201 Calculus of ureter: Secondary | ICD-10-CM | POA: Diagnosis not present

## 2015-04-19 DIAGNOSIS — R2681 Unsteadiness on feet: Secondary | ICD-10-CM | POA: Diagnosis not present

## 2015-04-19 DIAGNOSIS — M6281 Muscle weakness (generalized): Secondary | ICD-10-CM | POA: Diagnosis not present

## 2015-04-19 DIAGNOSIS — R2689 Other abnormalities of gait and mobility: Secondary | ICD-10-CM | POA: Diagnosis not present

## 2015-04-19 DIAGNOSIS — I639 Cerebral infarction, unspecified: Secondary | ICD-10-CM | POA: Diagnosis not present

## 2015-04-21 DIAGNOSIS — R2689 Other abnormalities of gait and mobility: Secondary | ICD-10-CM | POA: Diagnosis not present

## 2015-04-21 DIAGNOSIS — R2681 Unsteadiness on feet: Secondary | ICD-10-CM | POA: Diagnosis not present

## 2015-04-21 DIAGNOSIS — M6281 Muscle weakness (generalized): Secondary | ICD-10-CM | POA: Diagnosis not present

## 2015-04-21 DIAGNOSIS — I639 Cerebral infarction, unspecified: Secondary | ICD-10-CM | POA: Diagnosis not present

## 2015-04-23 NOTE — H&P (Signed)
Travis Costa is an 82 year old male with BPH and urinary retention as well as a distal right ureteral stone   History of Present Illness              He has a history of elevated PSA and has undergone biopsy twice in the past, both in 1994 and 1998. He had a TUNA procedure in 2002. He has had difficulty with UTIs in the past.  In 10/16 after a great deal of urethral manipulation and urinary retention in the hospital his PSA was checked and found to be 33.      Urinary retention: He developed urinary retention with 790 cc in his bladder in 2/12 secondary to a UTI. In addition at that time he had developed diarrhea due to starting metformin and was taking an antidiarrheal which also likely contributed to his acute retention. He was treated with antibiotics and placed on Rapaflo after which he underwent a voiding trial which was successful.   He remains on tamsulosin at this time.    Bladder lesion: He developed frequency urgency and dysuria and was treated medically without response. Cystoscopically I found a suspicious area on the posterior wall of the bladder on the right-hand side and in 7/12 he underwent transurethral resectional biopsy of the lesion which turned out to be benign inflammation only.    Gross hematuria: He developed gross hematuria during her recent hospitalization when he was placed on antiplatelet therapy. His urine eventually cleared and he was placed on finasteride.    Calculus disease: CT scan in 10/16 revealed a 4.5 mm stone in his distal right ureter. He was not having any discomfort nor was there any significant hydronephrosis.     Interval history: He has returned for follow-up of a distal right ureteral stone and has not been having any flank pain, hematuria or change in his voiding pattern. He remains in rehabilitation. He uses a walker. He has not had any new medical problems since I have seen him last.       Past Medical History Problems  1.  History of Acute cystitis without hematuria (N30.00) 2. History of depression (Z86.59) 3. History of diabetes mellitus (Z86.39) 4. History of esophageal reflux (Z87.19) 5. History of glaucoma (Z86.69) 6. History of hypertension (Z86.79) 7. History of Ischemic Stroke  Surgical History Problems  1. History of Appendectomy 2. History of Cystoscopy With Biopsy 3. History of Needle Biopsy Of Prostate 4. History of Needle Biopsy Of Prostate 5. History of Surg Prostate Transureth Dest Tissue Radiofreq Thermotherapy  Current Meds 1. HydroCHLOROthiazide 12.5 MG Oral Capsule;  Therapy: [id number] to Recorded 2. Januvia 100 MG Oral Tablet;  Therapy: [id number] to Recorded 3. Tamsulosin HCl - 0.4 MG Oral Capsule;  Therapy: (Recorded:21Feb2017) to Recorded 4. Trandolapril TABS;  Therapy: (Recorded:17May2012) to Recorded  Allergies Medication  1. No Known Drug Allergies  Family History Problems  1. Family history of Family Health Status Number Of Children : Child   2 sons and 2 daughter  Social History Problems  1. Denied: Alcohol Use (History) 2. Caffeine Use   2 per day 3. Family history of Death In The Family Father   age 70, heart 4. Family history of Death In The Family Mother   age 58, diabetes 5. Marital History - Currently Married 6. Never A Smoker 7. Occupation:   office work 8. Denied: Tobacco Use  Review of Systems Genitourinary, constitutional, skin, eye, otolaryngeal, hematologic/lymphatic, cardiovascular, pulmonary, endocrine, musculoskeletal, gastrointestinal, neurological and psychiatric system(s)  were reviewed and pertinent findings if present are noted and are otherwise negative.  Genitourinary: hematuria.    Vitals Vital Signs   Height: 5 ft 10 in Weight: 152 lb  BMI Calculated: 21.81 BSA Calculated: 1.86 Blood Pressure: 143 / 76 Temperature: 97.1 F Heart Rate: 95  Physical Exam Constitutional: Well nourished and well developed . No acute  distress.  ENT:. The ears and nose are normal in appearance.  Neck: The appearance of the neck is normal and no neck mass is present.  Pulmonary: No respiratory distress and normal respiratory rhythm and effort.  Cardiovascular: Heart rate and rhythm are normal . No peripheral edema.  Abdomen: The abdomen is soft and nontender. No masses are palpated. No CVA tenderness. No hernias are palpable. No hepatosplenomegaly noted.  Rectal: Rectal exam demonstrates normal sphincter tone, no tenderness and no masses. The prostate has no nodularity and is not tender. The left seminal vesicle is nonpalpable. The right seminal vesicle is nonpalpable. The perineum is normal on inspection.  Genitourinary: Examination of the penis demonstrates no discharge, no masses, no lesions and a normal meatus. The scrotum is without lesions. The right epididymis is palpably normal and non-tender. The left epididymis is palpably normal and non-tender. The right testis is non-tender and without masses. The left testis is non-tender and without masses.  Lymphatics: The femoral and inguinal nodes are not enlarged or tender.  Skin: Normal skin turgor, no visible rash and no visible skin lesions.  Neuro/Psych:. Mood and affect are appropriate.    Results/Data Urine  COLOR YELLOW  APPEARANCE CLOUDY  SPECIFIC GRAVITY 1.025  pH 5.5  GLUCOSE NEGATIVE  BILIRUBIN NEGATIVE  KETONE NEGATIVE  BLOOD TRACE  PROTEIN NEGATIVE  NITRITE NEGATIVE  LEUKOCYTE ESTERASE 3+  SQUAMOUS EPITHELIAL/HPF 0-5 HPF WBC >60 WBC/HPF RBC 3-10 RBC/HPF BACTERIA MODERATE HPF CRYSTALS NONE SEEN HPF CASTS NONE SEEN LPF Yeast NONE SEEN HPF  The following images/tracing/specimen were independently visualized:  KUB: The stone appears to be present within the right ureter and is unchanged in position.  The following clinical lab reports were reviewed:  His urine had red and white cells but was nitrite negative.    Assessment     His KUB revealed the  stone in the distal right ureter remains present and essentially unchanged from where it was 3 months ago. He is not having any symptoms but we discussed the concept of silent obstruction and the need to address the stone. He would not be a good candidate for ureteroscopic management because of the fact that he has a massive prostate that essentially blocks access to his ureteral orifice. In order to gain access he would likely develop a lot of hematuria which she has had difficulty with in the past from his enlarged prostate and therefore I discussed treatment of the stone with ESWL. For shockwave lithotripsy I described the risks which include arrhythmia, kidney contusion, kidney hemorrhage, need for transfusion, long-term risk of diabetes or hypertension, back discomfort, flank ecchymosis, flank abrasion, inability to break up stone, inability to pass stone fragments, Steinstrasse, infection associated with obstructing stones, need for different surgical procedure and possible need for repeat shockwave lithotripsy.    He will remain on tamsulosin but did not notice any change when he decreased from 2 pills down to 1 pill so I will have him remain on a single pill.  His urine was cultured preoperatively and found to be positive for Enterococcus and was sensitive to Levaquin.  He was treated with  a full course of Levaquin.  Plan   1. Continue finasteride and tamsulosin.  2. Scheduled for right distal ureteral lithotripsy

## 2015-04-24 ENCOUNTER — Encounter (HOSPITAL_COMMUNITY): Payer: Self-pay | Admitting: General Practice

## 2015-04-24 ENCOUNTER — Encounter (HOSPITAL_COMMUNITY)
Admission: RE | Disposition: A | Discharge status: Home / Self Care | Payer: Self-pay | Source: Ambulatory Visit | Attending: Urology

## 2015-04-24 ENCOUNTER — Ambulatory Visit (HOSPITAL_COMMUNITY)
Admission: RE | Admit: 2015-04-24 | Discharge: 2015-04-24 | Disposition: A | Discharge status: Home / Self Care | Payer: Medicare Other | Source: Ambulatory Visit | Attending: Urology | Admitting: Urology

## 2015-04-24 ENCOUNTER — Ambulatory Visit (HOSPITAL_COMMUNITY): Payer: Medicare Other

## 2015-04-24 DIAGNOSIS — I252 Old myocardial infarction: Secondary | ICD-10-CM | POA: Insufficient documentation

## 2015-04-24 DIAGNOSIS — Z79899 Other long term (current) drug therapy: Secondary | ICD-10-CM | POA: Diagnosis not present

## 2015-04-24 DIAGNOSIS — Z8673 Personal history of transient ischemic attack (TIA), and cerebral infarction without residual deficits: Secondary | ICD-10-CM | POA: Insufficient documentation

## 2015-04-24 DIAGNOSIS — I10 Essential (primary) hypertension: Secondary | ICD-10-CM | POA: Insufficient documentation

## 2015-04-24 DIAGNOSIS — N401 Enlarged prostate with lower urinary tract symptoms: Secondary | ICD-10-CM | POA: Insufficient documentation

## 2015-04-24 DIAGNOSIS — R338 Other retention of urine: Secondary | ICD-10-CM | POA: Insufficient documentation

## 2015-04-24 DIAGNOSIS — E119 Type 2 diabetes mellitus without complications: Secondary | ICD-10-CM | POA: Diagnosis not present

## 2015-04-24 DIAGNOSIS — Z01818 Encounter for other preprocedural examination: Secondary | ICD-10-CM | POA: Diagnosis not present

## 2015-04-24 DIAGNOSIS — Z7984 Long term (current) use of oral hypoglycemic drugs: Secondary | ICD-10-CM | POA: Diagnosis not present

## 2015-04-24 DIAGNOSIS — N201 Calculus of ureter: Secondary | ICD-10-CM

## 2015-04-24 HISTORY — DX: Acute myocardial infarction, unspecified: I21.9

## 2015-04-24 LAB — GLUCOSE, CAPILLARY: Glucose-Capillary: 146 mg/dL — ABNORMAL HIGH (ref 65–99)

## 2015-04-24 SURGERY — LITHOTRIPSY, ESWL
Anesthesia: LOCAL | Laterality: Right

## 2015-04-24 MED ORDER — TAMSULOSIN HCL 0.4 MG PO CAPS: 0.4000 mg | ORAL_CAPSULE | ORAL | Status: AC

## 2015-04-24 MED ORDER — TAMSULOSIN HCL 0.4 MG PO CAPS
0.4000 mg | ORAL_CAPSULE | Freq: Once | ORAL | Status: AC
  Administered 2015-04-24: 0.4 mg via ORAL
  Filled 2015-04-24: qty 1

## 2015-04-24 MED ORDER — DIPHENHYDRAMINE HCL 25 MG PO CAPS
25.0000 mg | ORAL_CAPSULE | ORAL | Status: AC
  Administered 2015-04-24: 25 mg via ORAL
  Filled 2015-04-24: qty 1

## 2015-04-24 MED ORDER — CIPROFLOXACIN HCL 500 MG PO TABS
500.0000 mg | ORAL_TABLET | ORAL | Status: AC
  Administered 2015-04-24: 500 mg via ORAL
  Filled 2015-04-24: qty 1

## 2015-04-24 MED ORDER — DIAZEPAM 5 MG PO TABS
10.0000 mg | ORAL_TABLET | ORAL | Status: AC
Start: 2015-04-24 — End: 2015-04-24
  Administered 2015-04-24: 10 mg via ORAL
  Filled 2015-04-24: qty 2

## 2015-04-24 MED ORDER — SODIUM CHLORIDE 0.9 % IV SOLN
INTRAVENOUS | Status: DC
  Administered 2015-04-24: 10:00:00 via INTRAVENOUS

## 2015-04-24 MED ORDER — HYDROCODONE-ACETAMINOPHEN 10-325 MG PO TABS: 1.0000 | ORAL_TABLET | ORAL | Status: AC | PRN

## 2015-04-24 NOTE — Op Note (Signed)
See [facility] OP note scanned into chart. Also because of the size, density, location and other factors that cannot be anticipated I feel this will likely be a staged procedure. This fact supersedes any indication in the scanned [facility] operative note to the contrary.  

## 2015-04-24 NOTE — Interval H&P Note (Signed)
History and Physical Interval Note:  04/24/2015 3:51 AM  [name]  has presented today for surgery, with the diagnosis of RIGHT DISTAL URETERAL STONE  The various methods of treatment have been discussed with the patient and family. After consideration of risks, benefits and other options for treatment, the patient has consented to  Procedure(s): RIGHT EXTRACORPOREAL SHOCK WAVE LITHOTRIPSY (ESWL) (Right) as a surgical intervention .  The patient's history has been reviewed, patient examined, no change in status, stable for surgery.  I have reviewed the patient's chart and labs.  Questions were answered to the patient's satisfaction.     Travis Costa

## 2015-04-26 DIAGNOSIS — R2689 Other abnormalities of gait and mobility: Secondary | ICD-10-CM | POA: Diagnosis not present

## 2015-04-26 DIAGNOSIS — R2681 Unsteadiness on feet: Secondary | ICD-10-CM | POA: Diagnosis not present

## 2015-04-26 DIAGNOSIS — M6281 Muscle weakness (generalized): Secondary | ICD-10-CM | POA: Diagnosis not present

## 2015-04-26 DIAGNOSIS — I639 Cerebral infarction, unspecified: Secondary | ICD-10-CM | POA: Diagnosis not present

## 2015-05-03 DIAGNOSIS — R2681 Unsteadiness on feet: Secondary | ICD-10-CM | POA: Diagnosis not present

## 2015-05-03 DIAGNOSIS — M6281 Muscle weakness (generalized): Secondary | ICD-10-CM | POA: Diagnosis not present

## 2015-05-03 DIAGNOSIS — I639 Cerebral infarction, unspecified: Secondary | ICD-10-CM | POA: Diagnosis not present

## 2015-05-03 DIAGNOSIS — R2689 Other abnormalities of gait and mobility: Secondary | ICD-10-CM | POA: Diagnosis not present

## 2015-05-08 DIAGNOSIS — N201 Calculus of ureter: Secondary | ICD-10-CM | POA: Diagnosis not present

## 2015-05-09 DIAGNOSIS — R2681 Unsteadiness on feet: Secondary | ICD-10-CM | POA: Diagnosis not present

## 2015-05-09 DIAGNOSIS — R2689 Other abnormalities of gait and mobility: Secondary | ICD-10-CM | POA: Diagnosis not present

## 2015-05-09 DIAGNOSIS — M6281 Muscle weakness (generalized): Secondary | ICD-10-CM | POA: Diagnosis not present

## 2015-05-09 DIAGNOSIS — I639 Cerebral infarction, unspecified: Secondary | ICD-10-CM | POA: Diagnosis not present

## 2015-05-11 DIAGNOSIS — R2689 Other abnormalities of gait and mobility: Secondary | ICD-10-CM | POA: Diagnosis not present

## 2015-05-11 DIAGNOSIS — R2681 Unsteadiness on feet: Secondary | ICD-10-CM | POA: Diagnosis not present

## 2015-05-11 DIAGNOSIS — I639 Cerebral infarction, unspecified: Secondary | ICD-10-CM | POA: Diagnosis not present

## 2015-05-11 DIAGNOSIS — M6281 Muscle weakness (generalized): Secondary | ICD-10-CM | POA: Diagnosis not present

## 2015-05-29 DIAGNOSIS — N3001 Acute cystitis with hematuria: Secondary | ICD-10-CM | POA: Diagnosis not present

## 2015-05-29 DIAGNOSIS — R319 Hematuria, unspecified: Secondary | ICD-10-CM | POA: Diagnosis not present

## 2015-06-05 DIAGNOSIS — N3090 Cystitis, unspecified without hematuria: Secondary | ICD-10-CM | POA: Diagnosis not present

## 2015-06-05 DIAGNOSIS — N3001 Acute cystitis with hematuria: Secondary | ICD-10-CM | POA: Diagnosis not present

## 2015-07-12 DIAGNOSIS — N3090 Cystitis, unspecified without hematuria: Secondary | ICD-10-CM | POA: Diagnosis not present

## 2015-07-12 DIAGNOSIS — N3001 Acute cystitis with hematuria: Secondary | ICD-10-CM | POA: Diagnosis not present

## 2015-07-13 DIAGNOSIS — I2510 Atherosclerotic heart disease of native coronary artery without angina pectoris: Secondary | ICD-10-CM | POA: Diagnosis not present

## 2015-07-13 DIAGNOSIS — I10 Essential (primary) hypertension: Secondary | ICD-10-CM | POA: Diagnosis not present

## 2015-07-27 DIAGNOSIS — D638 Anemia in other chronic diseases classified elsewhere: Secondary | ICD-10-CM | POA: Diagnosis not present

## 2015-07-27 DIAGNOSIS — H8113 Benign paroxysmal vertigo, bilateral: Secondary | ICD-10-CM | POA: Diagnosis not present

## 2015-07-27 DIAGNOSIS — I10 Essential (primary) hypertension: Secondary | ICD-10-CM | POA: Diagnosis not present

## 2015-07-28 DIAGNOSIS — N3001 Acute cystitis with hematuria: Secondary | ICD-10-CM | POA: Diagnosis not present

## 2015-08-17 DIAGNOSIS — Z719 Counseling, unspecified: Secondary | ICD-10-CM | POA: Diagnosis not present

## 2015-08-17 DIAGNOSIS — Z789 Other specified health status: Secondary | ICD-10-CM | POA: Diagnosis not present

## 2015-08-17 DIAGNOSIS — R42 Dizziness and giddiness: Secondary | ICD-10-CM | POA: Diagnosis not present

## 2015-08-25 DIAGNOSIS — R310 Gross hematuria: Secondary | ICD-10-CM | POA: Diagnosis not present

## 2015-08-25 DIAGNOSIS — N200 Calculus of kidney: Secondary | ICD-10-CM | POA: Diagnosis not present

## 2015-08-28 DIAGNOSIS — R310 Gross hematuria: Secondary | ICD-10-CM | POA: Diagnosis not present

## 2015-08-28 DIAGNOSIS — D638 Anemia in other chronic diseases classified elsewhere: Secondary | ICD-10-CM | POA: Diagnosis not present

## 2015-08-28 DIAGNOSIS — D509 Iron deficiency anemia, unspecified: Secondary | ICD-10-CM | POA: Diagnosis not present

## 2015-09-05 DIAGNOSIS — R3912 Poor urinary stream: Secondary | ICD-10-CM | POA: Diagnosis not present

## 2015-09-05 DIAGNOSIS — N401 Enlarged prostate with lower urinary tract symptoms: Secondary | ICD-10-CM | POA: Diagnosis not present

## 2015-09-05 DIAGNOSIS — R310 Gross hematuria: Secondary | ICD-10-CM | POA: Diagnosis not present

## 2015-10-03 DIAGNOSIS — M25562 Pain in left knee: Secondary | ICD-10-CM | POA: Diagnosis not present

## 2015-10-03 DIAGNOSIS — M25561 Pain in right knee: Secondary | ICD-10-CM | POA: Diagnosis not present

## 2015-10-03 DIAGNOSIS — R262 Difficulty in walking, not elsewhere classified: Secondary | ICD-10-CM | POA: Diagnosis not present

## 2015-10-03 DIAGNOSIS — M170 Bilateral primary osteoarthritis of knee: Secondary | ICD-10-CM | POA: Diagnosis not present

## 2015-10-04 DIAGNOSIS — E785 Hyperlipidemia, unspecified: Secondary | ICD-10-CM | POA: Diagnosis not present

## 2015-10-04 DIAGNOSIS — Z6825 Body mass index (BMI) 25.0-25.9, adult: Secondary | ICD-10-CM | POA: Diagnosis not present

## 2015-10-04 DIAGNOSIS — E119 Type 2 diabetes mellitus without complications: Secondary | ICD-10-CM | POA: Diagnosis not present

## 2015-10-04 DIAGNOSIS — I10 Essential (primary) hypertension: Secondary | ICD-10-CM | POA: Diagnosis not present

## 2015-10-12 DIAGNOSIS — M170 Bilateral primary osteoarthritis of knee: Secondary | ICD-10-CM | POA: Diagnosis not present

## 2015-10-12 DIAGNOSIS — M25562 Pain in left knee: Secondary | ICD-10-CM | POA: Diagnosis not present

## 2015-10-12 DIAGNOSIS — M25561 Pain in right knee: Secondary | ICD-10-CM | POA: Diagnosis not present

## 2015-10-19 DIAGNOSIS — M25562 Pain in left knee: Secondary | ICD-10-CM | POA: Diagnosis not present

## 2015-10-19 DIAGNOSIS — M170 Bilateral primary osteoarthritis of knee: Secondary | ICD-10-CM | POA: Diagnosis not present

## 2015-10-19 DIAGNOSIS — M25561 Pain in right knee: Secondary | ICD-10-CM | POA: Diagnosis not present

## 2015-10-26 DIAGNOSIS — R319 Hematuria, unspecified: Secondary | ICD-10-CM | POA: Diagnosis not present

## 2015-10-26 DIAGNOSIS — M25562 Pain in left knee: Secondary | ICD-10-CM | POA: Diagnosis not present

## 2015-10-26 DIAGNOSIS — M170 Bilateral primary osteoarthritis of knee: Secondary | ICD-10-CM | POA: Diagnosis not present

## 2015-10-26 DIAGNOSIS — R300 Dysuria: Secondary | ICD-10-CM | POA: Diagnosis not present

## 2015-10-26 DIAGNOSIS — M25561 Pain in right knee: Secondary | ICD-10-CM | POA: Diagnosis not present

## 2015-10-27 DIAGNOSIS — R310 Gross hematuria: Secondary | ICD-10-CM | POA: Diagnosis not present

## 2015-10-31 DIAGNOSIS — R310 Gross hematuria: Secondary | ICD-10-CM | POA: Diagnosis not present

## 2015-11-02 DIAGNOSIS — M25562 Pain in left knee: Secondary | ICD-10-CM | POA: Diagnosis not present

## 2015-11-02 DIAGNOSIS — M25561 Pain in right knee: Secondary | ICD-10-CM | POA: Diagnosis not present

## 2015-11-02 DIAGNOSIS — M170 Bilateral primary osteoarthritis of knee: Secondary | ICD-10-CM | POA: Diagnosis not present

## 2015-11-07 DIAGNOSIS — H401231 Low-tension glaucoma, bilateral, mild stage: Secondary | ICD-10-CM | POA: Diagnosis not present

## 2015-11-20 DIAGNOSIS — R300 Dysuria: Secondary | ICD-10-CM | POA: Diagnosis not present

## 2015-11-20 DIAGNOSIS — N3091 Cystitis, unspecified with hematuria: Secondary | ICD-10-CM | POA: Diagnosis not present

## 2015-11-28 DIAGNOSIS — R300 Dysuria: Secondary | ICD-10-CM | POA: Diagnosis not present

## 2015-11-28 DIAGNOSIS — R351 Nocturia: Secondary | ICD-10-CM | POA: Diagnosis not present

## 2015-11-28 DIAGNOSIS — N200 Calculus of kidney: Secondary | ICD-10-CM | POA: Diagnosis not present

## 2015-12-05 DIAGNOSIS — N200 Calculus of kidney: Secondary | ICD-10-CM | POA: Diagnosis not present

## 2015-12-05 DIAGNOSIS — R300 Dysuria: Secondary | ICD-10-CM | POA: Diagnosis not present

## 2015-12-05 DIAGNOSIS — Z09 Encounter for follow-up examination after completed treatment for conditions other than malignant neoplasm: Secondary | ICD-10-CM | POA: Diagnosis not present

## 2015-12-26 DIAGNOSIS — N400 Enlarged prostate without lower urinary tract symptoms: Secondary | ICD-10-CM | POA: Diagnosis not present

## 2015-12-26 DIAGNOSIS — R8290 Unspecified abnormal findings in urine: Secondary | ICD-10-CM | POA: Diagnosis not present

## 2015-12-26 DIAGNOSIS — R339 Retention of urine, unspecified: Secondary | ICD-10-CM | POA: Diagnosis not present

## 2016-01-10 DIAGNOSIS — E119 Type 2 diabetes mellitus without complications: Secondary | ICD-10-CM | POA: Diagnosis not present

## 2016-01-10 DIAGNOSIS — N390 Urinary tract infection, site not specified: Secondary | ICD-10-CM | POA: Diagnosis not present

## 2016-01-11 DIAGNOSIS — R310 Gross hematuria: Secondary | ICD-10-CM | POA: Diagnosis not present

## 2016-01-11 DIAGNOSIS — I10 Essential (primary) hypertension: Secondary | ICD-10-CM | POA: Diagnosis not present

## 2016-01-11 DIAGNOSIS — Z1389 Encounter for screening for other disorder: Secondary | ICD-10-CM | POA: Diagnosis not present

## 2016-02-12 DIAGNOSIS — E119 Type 2 diabetes mellitus without complications: Secondary | ICD-10-CM | POA: Diagnosis not present

## 2016-02-12 DIAGNOSIS — I10 Essential (primary) hypertension: Secondary | ICD-10-CM | POA: Diagnosis not present

## 2016-02-12 DIAGNOSIS — Z9181 History of falling: Secondary | ICD-10-CM | POA: Diagnosis not present

## 2016-02-12 DIAGNOSIS — Z79899 Other long term (current) drug therapy: Secondary | ICD-10-CM | POA: Diagnosis not present

## 2016-02-12 DIAGNOSIS — E785 Hyperlipidemia, unspecified: Secondary | ICD-10-CM | POA: Diagnosis not present

## 2016-02-20 DIAGNOSIS — M25462 Effusion, left knee: Secondary | ICD-10-CM | POA: Diagnosis not present

## 2016-02-20 DIAGNOSIS — M25562 Pain in left knee: Secondary | ICD-10-CM | POA: Diagnosis not present

## 2016-02-20 DIAGNOSIS — M170 Bilateral primary osteoarthritis of knee: Secondary | ICD-10-CM | POA: Diagnosis not present

## 2016-02-20 DIAGNOSIS — M25561 Pain in right knee: Secondary | ICD-10-CM | POA: Diagnosis not present

## 2016-02-28 DIAGNOSIS — I2510 Atherosclerotic heart disease of native coronary artery without angina pectoris: Secondary | ICD-10-CM | POA: Diagnosis not present

## 2016-02-28 DIAGNOSIS — I10 Essential (primary) hypertension: Secondary | ICD-10-CM | POA: Diagnosis not present

## 2016-02-28 DIAGNOSIS — E119 Type 2 diabetes mellitus without complications: Secondary | ICD-10-CM | POA: Diagnosis not present

## 2016-02-28 DIAGNOSIS — I429 Cardiomyopathy, unspecified: Secondary | ICD-10-CM | POA: Diagnosis not present

## 2016-02-29 DIAGNOSIS — M25562 Pain in left knee: Secondary | ICD-10-CM | POA: Diagnosis not present

## 2016-02-29 DIAGNOSIS — M25561 Pain in right knee: Secondary | ICD-10-CM | POA: Diagnosis not present

## 2016-02-29 DIAGNOSIS — M170 Bilateral primary osteoarthritis of knee: Secondary | ICD-10-CM | POA: Diagnosis not present

## 2016-03-07 DIAGNOSIS — M25562 Pain in left knee: Secondary | ICD-10-CM | POA: Diagnosis not present

## 2016-03-07 DIAGNOSIS — M25561 Pain in right knee: Secondary | ICD-10-CM | POA: Diagnosis not present

## 2016-03-07 DIAGNOSIS — M170 Bilateral primary osteoarthritis of knee: Secondary | ICD-10-CM | POA: Diagnosis not present

## 2016-03-26 DIAGNOSIS — R339 Retention of urine, unspecified: Secondary | ICD-10-CM | POA: Diagnosis not present

## 2016-03-26 DIAGNOSIS — R319 Hematuria, unspecified: Secondary | ICD-10-CM | POA: Diagnosis not present

## 2016-03-26 DIAGNOSIS — N400 Enlarged prostate without lower urinary tract symptoms: Secondary | ICD-10-CM | POA: Diagnosis not present

## 2016-03-26 DIAGNOSIS — Z87448 Personal history of other diseases of urinary system: Secondary | ICD-10-CM | POA: Diagnosis not present

## 2016-03-26 DIAGNOSIS — R8290 Unspecified abnormal findings in urine: Secondary | ICD-10-CM | POA: Diagnosis not present

## 2016-04-08 DIAGNOSIS — N390 Urinary tract infection, site not specified: Secondary | ICD-10-CM | POA: Diagnosis not present

## 2016-04-08 DIAGNOSIS — R319 Hematuria, unspecified: Secondary | ICD-10-CM | POA: Diagnosis not present

## 2016-04-13 IMAGING — DX DG CHEST 2V
2 series · 3 of 3 positions shown · non-contrast
Comparison: None in PACs

CLINICAL DATA: Cough and weakness, history of CHF, pleural
effusions, hematuria.

EXAM:
CHEST  2 VIEW

[Series 2: chest lat · 0.14mm/px · 2 of 2 slices shown]
[im 1/2]
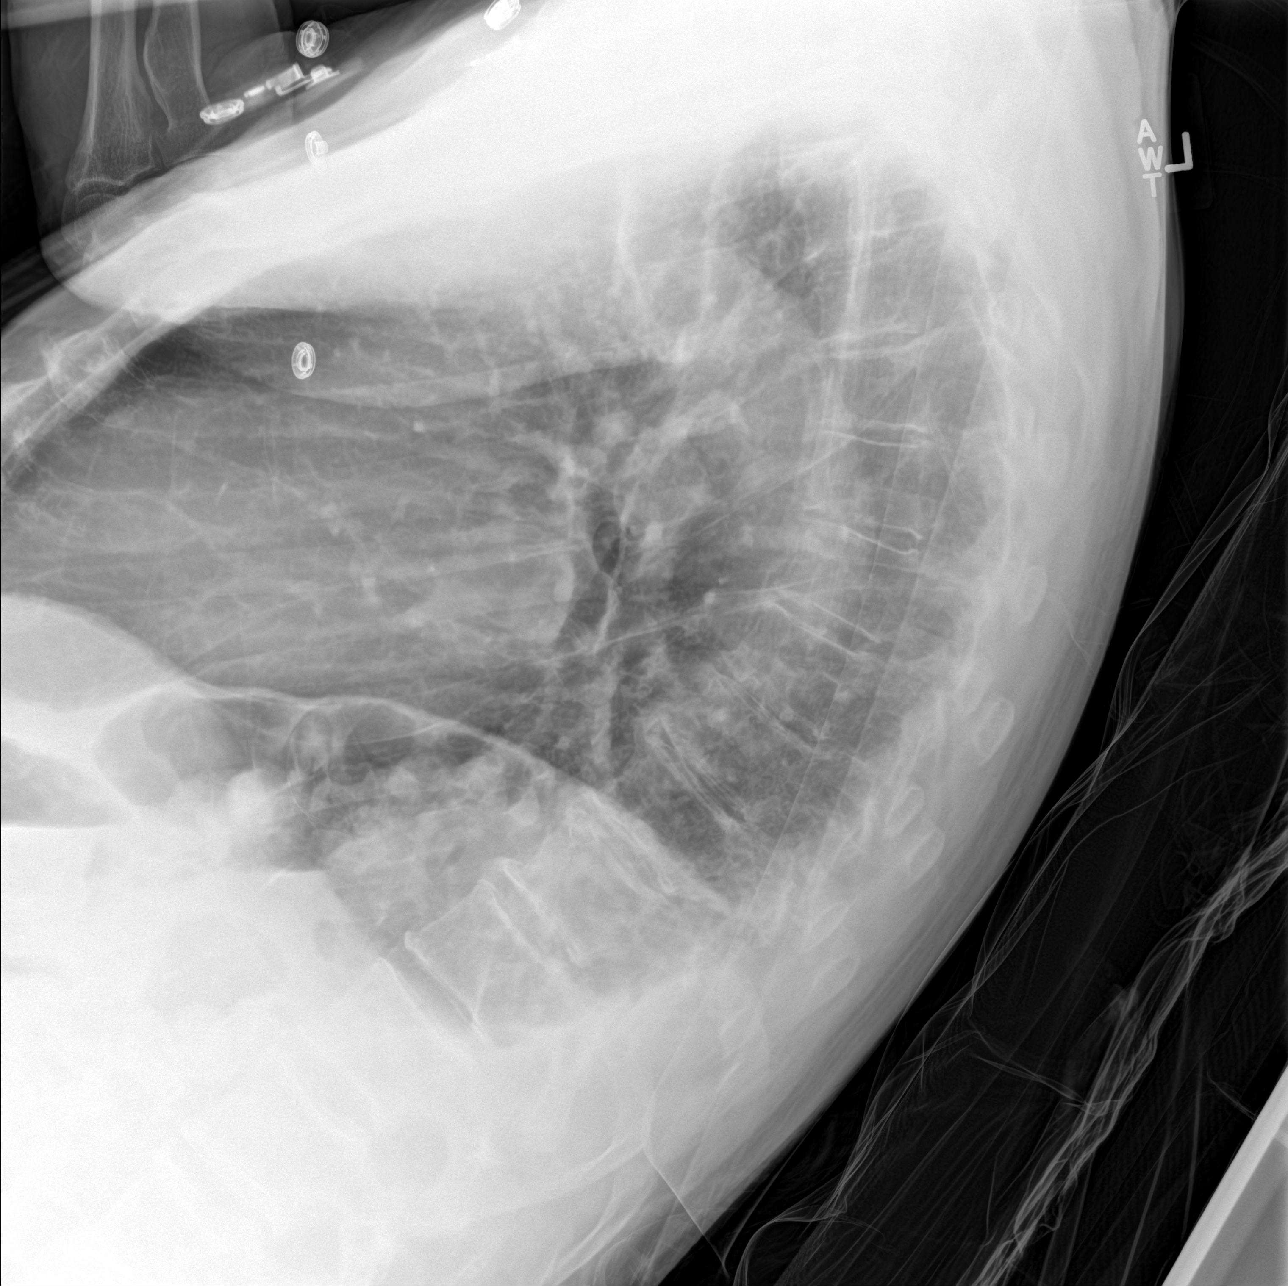
[im 2/2]
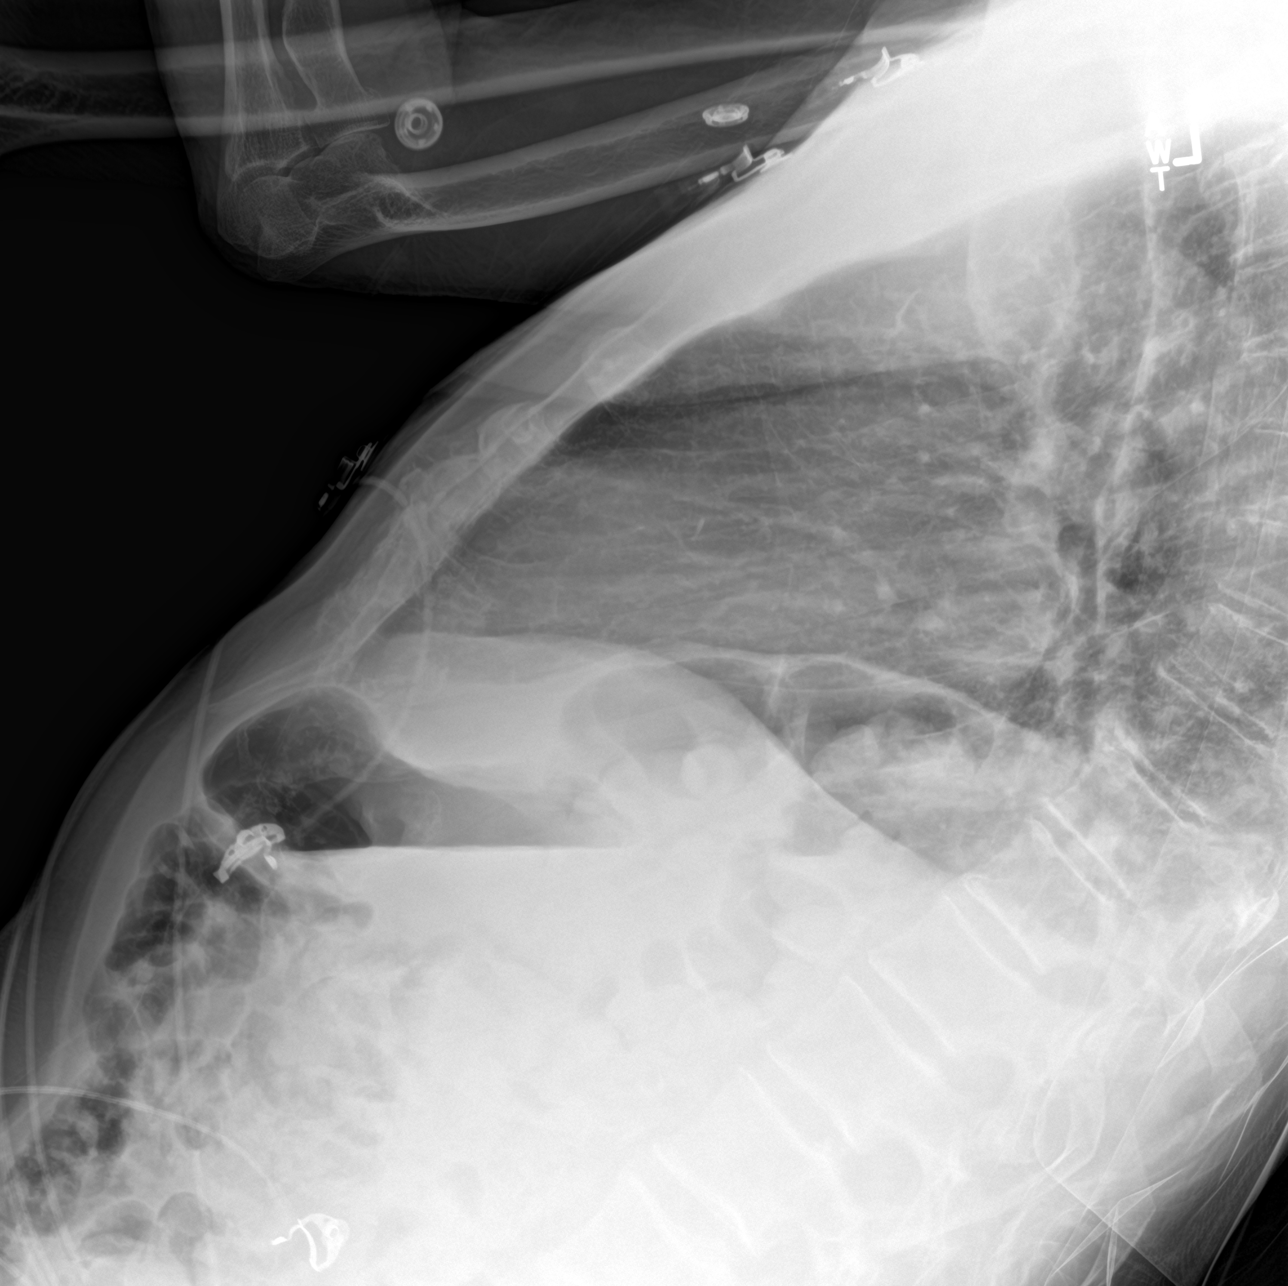

[chest ap]
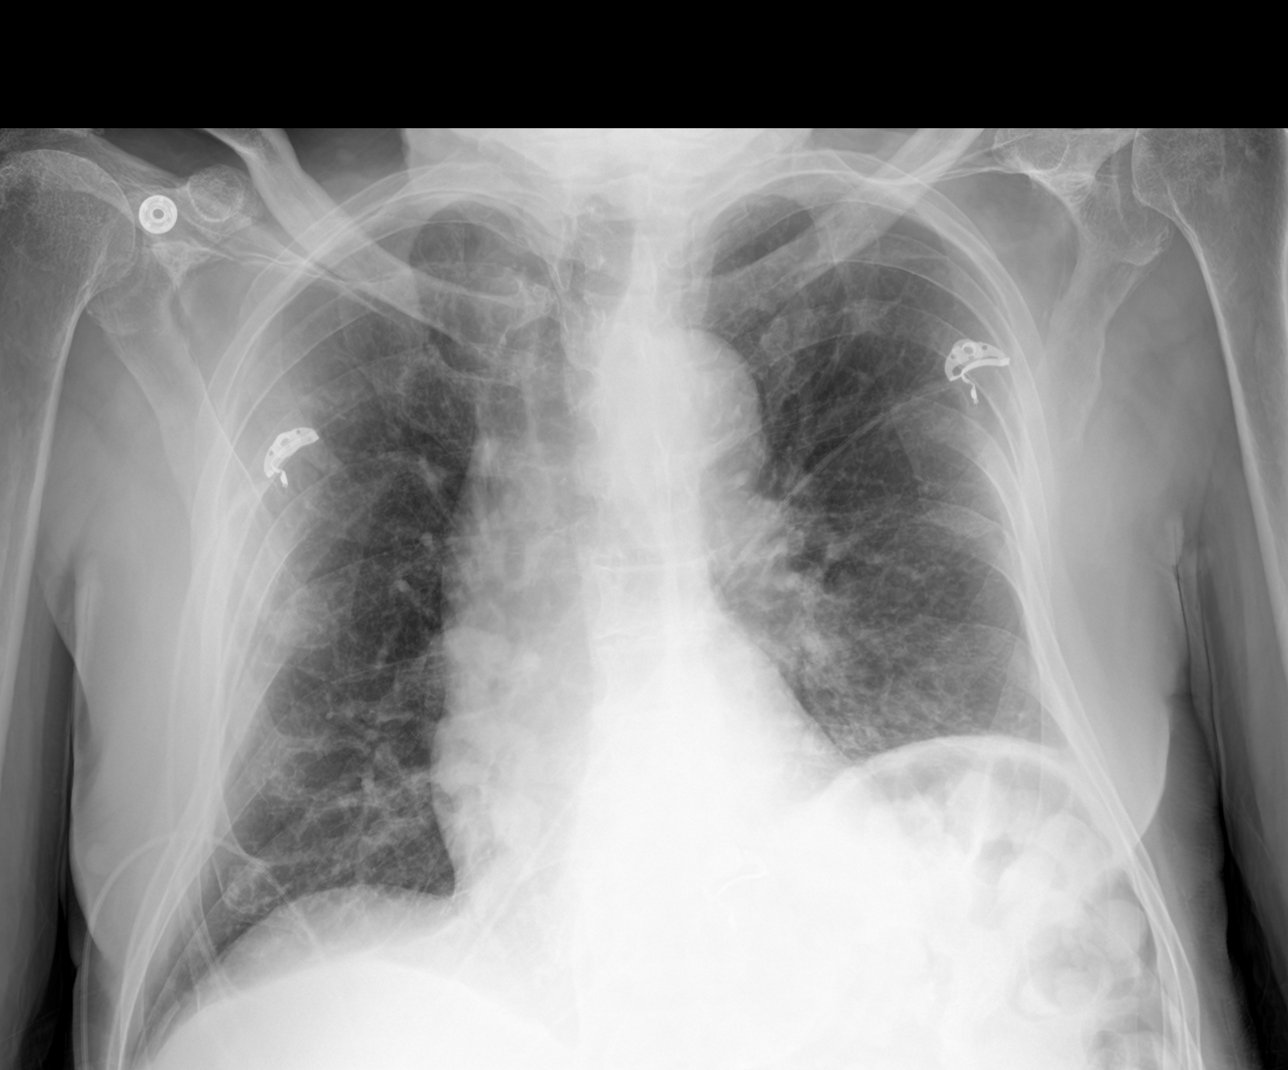

[3 of 3 positions shown; findings below may reference images not displayed]

FINDINGS: The lungs are adequately inflated. Mild elevation of the left
hemidiaphragm is noted but may be positional. The heart and
pulmonary vascularity are normal. The pulmonary interstitial
markings are coarse. The pulmonary vascularity is not engorged.
There is a small pleural effusion on the right layering posteriorly.
The bony thorax exhibits no acute abnormality.
IMPRESSION: Small right pleural effusion. No definite evidence of pneumonia nor
pulmonary edema.

## 2016-04-23 DIAGNOSIS — R8290 Unspecified abnormal findings in urine: Secondary | ICD-10-CM | POA: Diagnosis not present

## 2016-04-23 DIAGNOSIS — N401 Enlarged prostate with lower urinary tract symptoms: Secondary | ICD-10-CM | POA: Diagnosis not present

## 2016-04-23 DIAGNOSIS — N138 Other obstructive and reflux uropathy: Secondary | ICD-10-CM | POA: Diagnosis not present

## 2016-05-07 DIAGNOSIS — H401231 Low-tension glaucoma, bilateral, mild stage: Secondary | ICD-10-CM | POA: Diagnosis not present

## 2016-05-08 DIAGNOSIS — H34212 Partial retinal artery occlusion, left eye: Secondary | ICD-10-CM | POA: Diagnosis not present

## 2016-05-08 DIAGNOSIS — Z79899 Other long term (current) drug therapy: Secondary | ICD-10-CM | POA: Diagnosis not present

## 2016-05-08 DIAGNOSIS — E119 Type 2 diabetes mellitus without complications: Secondary | ICD-10-CM | POA: Diagnosis not present

## 2016-05-08 DIAGNOSIS — E785 Hyperlipidemia, unspecified: Secondary | ICD-10-CM | POA: Diagnosis not present

## 2016-05-08 DIAGNOSIS — I10 Essential (primary) hypertension: Secondary | ICD-10-CM | POA: Diagnosis not present

## 2016-05-10 DIAGNOSIS — I119 Hypertensive heart disease without heart failure: Secondary | ICD-10-CM | POA: Diagnosis not present

## 2016-05-10 DIAGNOSIS — I509 Heart failure, unspecified: Secondary | ICD-10-CM | POA: Diagnosis not present

## 2016-05-10 DIAGNOSIS — I6523 Occlusion and stenosis of bilateral carotid arteries: Secondary | ICD-10-CM | POA: Diagnosis not present

## 2016-05-17 ENCOUNTER — Telehealth: Payer: Self-pay | Admitting: Family Medicine

## 2016-05-17 NOTE — Telephone Encounter (Signed)
Patient left message at our office ([facility]) returning a call to schedule an appointment. I spoke with the patient's wife to confirm which doctor sent the referral. I left a message with Dr. Ebony Hail office to fax [place] a referral and office note so we can schedule an appointment.

## 2016-05-21 ENCOUNTER — Other Ambulatory Visit: Payer: Self-pay | Admitting: *Deleted

## 2016-05-21 ENCOUNTER — Encounter: Payer: Medicare Other | Admitting: Vascular Surgery

## 2016-05-21 DIAGNOSIS — I6522 Occlusion and stenosis of left carotid artery: Secondary | ICD-10-CM

## 2016-05-31 ENCOUNTER — Encounter: Payer: Self-pay | Admitting: Vascular Surgery

## 2016-06-05 DIAGNOSIS — I2510 Atherosclerotic heart disease of native coronary artery without angina pectoris: Secondary | ICD-10-CM | POA: Diagnosis not present

## 2016-06-05 DIAGNOSIS — I429 Cardiomyopathy, unspecified: Secondary | ICD-10-CM | POA: Diagnosis not present

## 2016-06-05 DIAGNOSIS — I10 Essential (primary) hypertension: Secondary | ICD-10-CM | POA: Diagnosis not present

## 2016-06-05 DIAGNOSIS — E119 Type 2 diabetes mellitus without complications: Secondary | ICD-10-CM | POA: Diagnosis not present

## 2016-06-12 ENCOUNTER — Ambulatory Visit (HOSPITAL_COMMUNITY)
Admission: RE | Admit: 2016-06-12 | Discharge: 2016-06-12 | Disposition: A | Discharge status: Home / Self Care | Payer: Medicare Other | Source: Ambulatory Visit | Attending: Vascular Surgery | Admitting: Vascular Surgery

## 2016-06-12 ENCOUNTER — Ambulatory Visit (INDEPENDENT_AMBULATORY_CARE_PROVIDER_SITE_OTHER): Payer: Medicare Other | Admitting: Vascular Surgery

## 2016-06-12 ENCOUNTER — Encounter: Payer: Self-pay | Admitting: Vascular Surgery

## 2016-06-12 VITALS — BP 122/80 | HR 81 | Temp 97.6°F | Resp 18 | Ht 67.0 in | Wt 175.0 lb

## 2016-06-12 DIAGNOSIS — I6522 Occlusion and stenosis of left carotid artery: Secondary | ICD-10-CM

## 2016-06-12 DIAGNOSIS — I6523 Occlusion and stenosis of bilateral carotid arteries: Secondary | ICD-10-CM | POA: Diagnosis not present

## 2016-06-12 LAB — VAS US CAROTID
LCCADDIAS: -13 cm/s
LCCADSYS: -55 cm/s
LCCAPDIAS: 7 cm/s
LCCAPSYS: 65 cm/s
LEFT ECA DIAS: -6 cm/s
LEFT VERTEBRAL DIAS: -19 cm/s
LICADDIAS: -23 cm/s
LICADSYS: -70 cm/s
Left ICA prox dias: -86 cm/s
Left ICA prox sys: -262 cm/s
RCCADSYS: 43 cm/s
RCCAPSYS: 47 cm/s
RIGHT CCA MID DIAS: 12 cm/s
RIGHT ECA DIAS: -3 cm/s
RIGHT VERTEBRAL DIAS: -18 cm/s
Right CCA prox dias: 14 cm/s

## 2016-06-12 NOTE — Progress Notes (Signed)
Patient name: Travis Costa MRN: [id number] DOB: [birth year] Sex: male   REASON FOR CONSULT:    Greater than 70% left carotid stenosis. The consult is requested by Dr. Venetia Maxon.   HPI:   Travis Costa is a 83 y.o. 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 This prompted a carotid duplex scan which showed a greater than 70% left carotid stenosis and he is sent for vascular consultation. The patient is right-handed. He denies any history of stroke, TIAs, expressive or receptive aphasia, or amaurosis fugax.  I have reviewed the carotid duplex scan that was done at [facility] on 05/10/2016. This suggested a greater than 70% left internal carotid artery stenosis with no significant stenosis on the right side.  I have reviewed the records that were sent from [facility] [place]. The patient has a history of type 2 diabetes which is controlled with medications. He has hyperlipidemia but does not take statins because of myalgias. He did have a Hollenhorst plaque noted in the left eye in April of this year which prompted the carotid duplex scan.  Past Medical History:  Diagnosis Date  . Benign essential HTN   . BPH (benign prostatic hyperplasia)   . CAD (coronary artery disease)   . CVA (cerebral infarction)    1990  . Myocardial infarction Sept 2016  . Pleural effusion    Once in past S/P Thoracentesis  . Systolic CHF ([facility])   . Urinary retention     Family History  Problem Relation Age of Onset  . CAD Mother   . Diabetes Mother   . Diabetes Father     SOCIAL HISTORY: Social History   Social History  . Marital status: Married    Spouse name: N/A  . Number of children: N/A  . Years of education: N/A   Occupational History  . Not on file.   Social History Main Topics  . Smoking status: Never Smoker  . Smokeless tobacco: Never Used  . Alcohol use No  . Drug use: No  . Sexual activity: Not on file   Other  Topics Concern  . Not on file   Social History Narrative  . No narrative on file    Allergies  Allergen Reactions  . Percocet [Oxycodone-Acetaminophen] Other (See Comments)    Confusion  . Trazodone And Nefazodone     Disoriented per wife    Current Outpatient Prescriptions  Medication Sig Dispense Refill  . acetaminophen (TYLENOL) 650 MG CR tablet Take 650 mg by mouth every 8 (eight) hours as needed for pain.    Marland Kitchen dicyclomine (BENTYL) 10 MG capsule Take 10-20 mg by mouth 4 (four) times daily -  before meals and at bedtime.    . feeding supplement, GLUCERNA SHAKE, (GLUCERNA SHAKE) LIQD Take 237 mLs by mouth 3 (three) times daily between meals.    . hydrochlorothiazide (MICROZIDE) 12.5 MG capsule Take 12.5 mg by mouth daily as needed (for high blood pressure).    Marland Kitchen HYDROcodone-acetaminophen (NORCO) 10-325 MG tablet Take 1-2 tablets by mouth every 4 (four) hours as needed for moderate pain. Maximum dose per 24 hours -8 pills. 30 tablet 0  . sitaGLIPtin (JANUVIA) 100 MG tablet Take 50 mg by mouth daily.    . tamsulosin (FLOMAX) 0.4 MG CAPS capsule Take 2 capsules (0.8 mg total) by mouth daily. 60 capsule 0  . tamsulosin (FLOMAX) 0.4 MG CAPS capsule Take 1 capsule (0.4 mg total)  by mouth daily after supper. 30 capsule 0  . trandolapril (MAVIK) 2 MG tablet Take 2 mg by mouth daily.     No current facility-administered medications for this visit.     REVIEW OF SYSTEMS:  [X]  denotes positive finding, [ ]  denotes negative finding Cardiac  Comments:  Chest pain or chest pressure:    Shortness of breath upon exertion:    Short of breath when lying flat:    Irregular heart rhythm:        Vascular    Pain in calf, thigh, or hip brought on by ambulation:    Pain in feet at night that wakes you up from your sleep:     Blood clot in your veins:    Leg swelling:         Pulmonary    Oxygen at home:    Productive cough:     Wheezing:         Neurologic    Sudden weakness in arms or  legs:     Sudden numbness in arms or legs:     Sudden onset of difficulty speaking or slurred speech:    Temporary loss of vision in one eye:     Problems with dizziness:  X       Gastrointestinal    Blood in stool:     Vomited blood:         Genitourinary    Burning when urinating:     Blood in urine:        Psychiatric    Major depression:         Hematologic    Bleeding problems:    Problems with blood clotting too easily:        Skin    Rashes or ulcers:        Constitutional    Fever or chills:     PHYSICAL EXAM:   Vitals:   06/12/16 1237  BP: (!) 153/84  Pulse: 78  Resp: 18  Temp: 97.6 F (36.4 C)  SpO2: 95%  Weight: 175 lb (79.4 kg)  Height: 5\' 7"  (1.702 m)    GENERAL: The patient is a well-nourished male, in no acute distress. The vital signs are documented above. CARDIAC: There is a regular rate and rhythm.  VASCULAR: I do not detect carotid bruits. I cannot palpate pedal pulses however he has warm well perfused feet. He has no significant lower extremity swelling. PULMONARY: There is good air exchange bilaterally without wheezing or rales. ABDOMEN: Soft and non-tender with normal pitched bowel sounds.  MUSCULOSKELETAL: There are no major deformities or cyanosis. NEUROLOGIC: No focal weakness or paresthesias are detected. SKIN: There are no ulcers or rashes noted. PSYCHIATRIC: The patient has a normal affect.  DATA:    CAROTID DUPLEX: I have independently interpreted his carotid duplex scan that was done today.  On the left side, which is the site of concern, he has a 60-79% carotid stenosis. End-diastolic velocity on the left is 82 cm/s suggesting that this is less than an 80% stenosis.  On the right side he has a less than 39% stenosis.  Both vertebral arteries are patent with antegrade flow.  MEDICAL ISSUES:   60-79% LEFT CAROTID STENOSIS: This patient has an asymptomatic less than 80% left carotid stenosis. He understands we would not  consider left carotid endarterectomy in a normal risk patient unless the stenosis progressed to greater than 80% or he became symptomatic. He is 83 years old and on  recently would be at slightly higher risk for surgery. I have recommended a follow up duplex scan 6 months and I'll see him back at that time. He is on aspirin. He does not tolerate statins. He is not a smoker. He knows to call sooner if he has problems.  Deitra Mayo [facility] [phone]

## 2016-06-12 NOTE — Progress Notes (Signed)
Vitals:   06/12/16 1237 06/12/16 1239  BP: (!) 153/84 124/77  Pulse: 78 81  Resp: 18   Temp: 97.6 F (36.4 C)   SpO2: 95%   Weight: 175 lb (79.4 kg)   Height: 5\' 7"  (1.702 m)

## 2016-06-18 ENCOUNTER — Encounter: Payer: Medicare Other | Admitting: Vascular Surgery

## 2016-06-18 ENCOUNTER — Encounter (HOSPITAL_COMMUNITY): Payer: Medicare Other

## 2016-06-18 DIAGNOSIS — H34212 Partial retinal artery occlusion, left eye: Secondary | ICD-10-CM | POA: Diagnosis not present

## 2016-06-21 NOTE — Addendum Note (Signed)
Addended by: Lianne Cure A on: 06/21/2016 01:43 PM   Modules accepted: Orders

## 2016-07-02 DIAGNOSIS — N029 Recurrent and persistent hematuria with unspecified morphologic changes: Secondary | ICD-10-CM | POA: Diagnosis not present

## 2016-07-02 DIAGNOSIS — Z6825 Body mass index (BMI) 25.0-25.9, adult: Secondary | ICD-10-CM | POA: Diagnosis not present

## 2016-07-02 DIAGNOSIS — N390 Urinary tract infection, site not specified: Secondary | ICD-10-CM | POA: Diagnosis not present

## 2016-07-02 DIAGNOSIS — L600 Ingrowing nail: Secondary | ICD-10-CM | POA: Diagnosis not present

## 2016-07-03 DIAGNOSIS — N390 Urinary tract infection, site not specified: Secondary | ICD-10-CM | POA: Diagnosis not present

## 2016-07-09 DIAGNOSIS — N400 Enlarged prostate without lower urinary tract symptoms: Secondary | ICD-10-CM | POA: Diagnosis not present

## 2016-09-17 DIAGNOSIS — I6522 Occlusion and stenosis of left carotid artery: Secondary | ICD-10-CM | POA: Diagnosis not present

## 2016-09-17 DIAGNOSIS — E663 Overweight: Secondary | ICD-10-CM | POA: Diagnosis not present

## 2016-09-21 DIAGNOSIS — N401 Enlarged prostate with lower urinary tract symptoms: Secondary | ICD-10-CM | POA: Diagnosis not present

## 2016-09-21 DIAGNOSIS — R310 Gross hematuria: Secondary | ICD-10-CM | POA: Diagnosis not present

## 2016-09-21 DIAGNOSIS — R300 Dysuria: Secondary | ICD-10-CM | POA: Diagnosis not present

## 2016-10-02 DIAGNOSIS — I10 Essential (primary) hypertension: Secondary | ICD-10-CM | POA: Diagnosis not present

## 2016-10-02 DIAGNOSIS — I499 Cardiac arrhythmia, unspecified: Secondary | ICD-10-CM | POA: Diagnosis not present

## 2016-10-02 DIAGNOSIS — E119 Type 2 diabetes mellitus without complications: Secondary | ICD-10-CM | POA: Diagnosis not present

## 2016-10-02 DIAGNOSIS — I429 Cardiomyopathy, unspecified: Secondary | ICD-10-CM | POA: Diagnosis not present

## 2016-10-02 DIAGNOSIS — I498 Other specified cardiac arrhythmias: Secondary | ICD-10-CM | POA: Diagnosis not present

## 2016-10-02 DIAGNOSIS — I2510 Atherosclerotic heart disease of native coronary artery without angina pectoris: Secondary | ICD-10-CM | POA: Diagnosis not present

## 2016-10-09 DIAGNOSIS — E119 Type 2 diabetes mellitus without complications: Secondary | ICD-10-CM | POA: Diagnosis not present

## 2016-10-09 DIAGNOSIS — R9431 Abnormal electrocardiogram [ECG] [EKG]: Secondary | ICD-10-CM | POA: Diagnosis not present

## 2016-10-09 DIAGNOSIS — I10 Essential (primary) hypertension: Secondary | ICD-10-CM | POA: Diagnosis not present

## 2016-10-09 DIAGNOSIS — I2510 Atherosclerotic heart disease of native coronary artery without angina pectoris: Secondary | ICD-10-CM | POA: Diagnosis not present

## 2016-10-09 DIAGNOSIS — I498 Other specified cardiac arrhythmias: Secondary | ICD-10-CM | POA: Diagnosis not present

## 2016-10-09 DIAGNOSIS — I499 Cardiac arrhythmia, unspecified: Secondary | ICD-10-CM | POA: Diagnosis not present

## 2016-10-09 DIAGNOSIS — I429 Cardiomyopathy, unspecified: Secondary | ICD-10-CM | POA: Diagnosis not present

## 2016-10-10 DIAGNOSIS — I10 Essential (primary) hypertension: Secondary | ICD-10-CM | POA: Diagnosis not present

## 2016-10-10 DIAGNOSIS — Z6824 Body mass index (BMI) 24.0-24.9, adult: Secondary | ICD-10-CM | POA: Diagnosis not present

## 2016-10-10 DIAGNOSIS — Z79899 Other long term (current) drug therapy: Secondary | ICD-10-CM | POA: Diagnosis not present

## 2016-10-10 DIAGNOSIS — E785 Hyperlipidemia, unspecified: Secondary | ICD-10-CM | POA: Diagnosis not present

## 2016-10-10 DIAGNOSIS — E119 Type 2 diabetes mellitus without complications: Secondary | ICD-10-CM | POA: Diagnosis not present

## 2016-10-19 IMAGING — CR DG ABDOMEN 1V
2 series · 2 of 2 positions shown · non-contrast
Comparison: 04/17/2015

CLINICAL DATA: Preop for right ureteral stone

EXAM:
ABDOMEN - 1 VIEW

[t abdomen supine (1 of 2)]
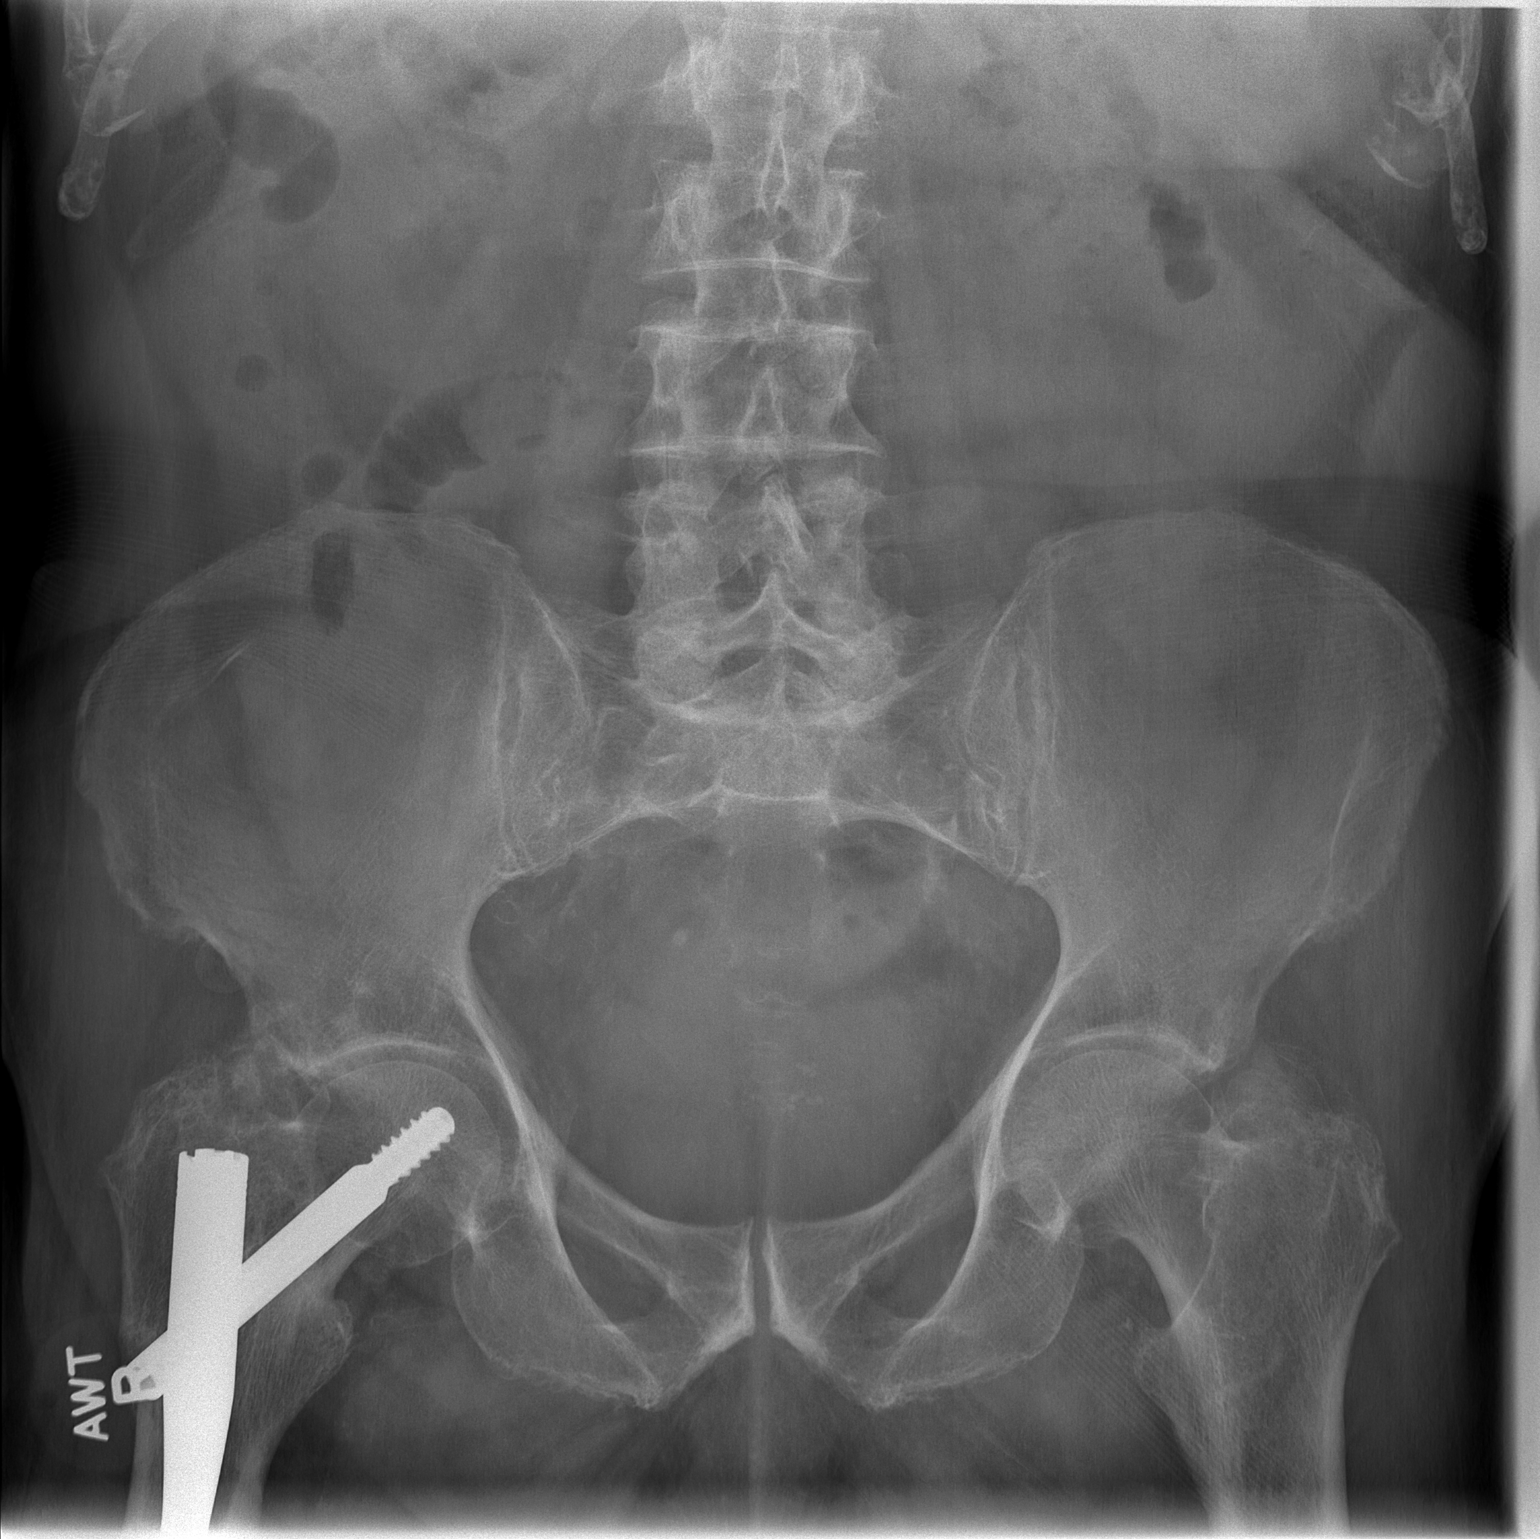

[t abdomen supine (2 of 2)]
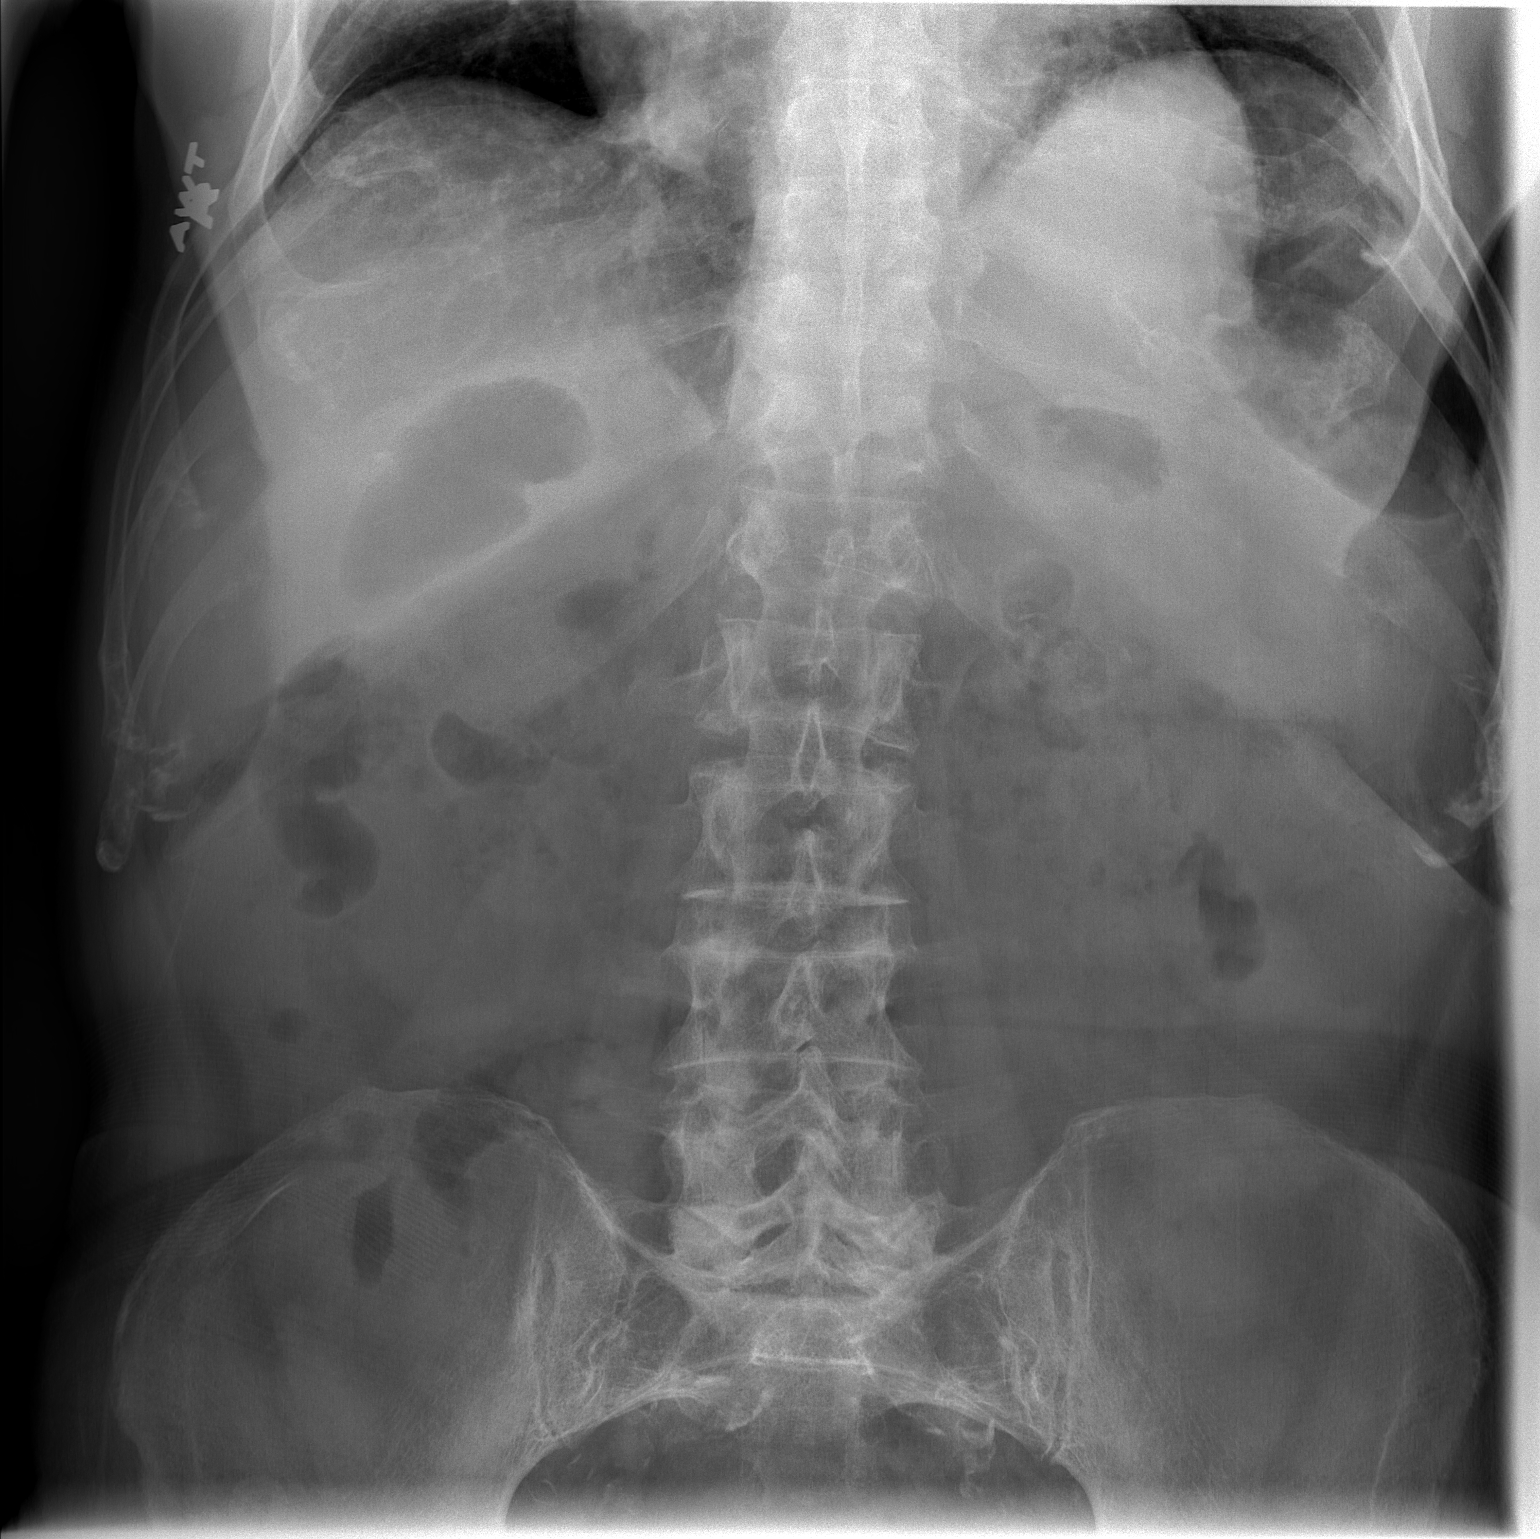

[2 of 2 positions shown; findings below may reference images not displayed]

FINDINGS: There is normal small bowel gas pattern. Metallic fixation material
proximal right femur again noted. Again noted 5 mm calcification in
right pelvis. Distal right ureteral calculus cannot be excluded. No
definite renal calcifications are noted.
IMPRESSION: Again noted 5 mm calcification in right pelvis. Distal right
ureteral calculus cannot be excluded.

## 2016-10-31 DIAGNOSIS — N401 Enlarged prostate with lower urinary tract symptoms: Secondary | ICD-10-CM | POA: Diagnosis not present

## 2016-10-31 DIAGNOSIS — Z87448 Personal history of other diseases of urinary system: Secondary | ICD-10-CM | POA: Diagnosis not present

## 2016-10-31 DIAGNOSIS — N138 Other obstructive and reflux uropathy: Secondary | ICD-10-CM | POA: Diagnosis not present

## 2016-11-18 DIAGNOSIS — I6522 Occlusion and stenosis of left carotid artery: Secondary | ICD-10-CM | POA: Diagnosis not present

## 2016-11-18 DIAGNOSIS — E663 Overweight: Secondary | ICD-10-CM | POA: Diagnosis not present

## 2016-11-19 DIAGNOSIS — H401231 Low-tension glaucoma, bilateral, mild stage: Secondary | ICD-10-CM | POA: Diagnosis not present

## 2016-12-18 ENCOUNTER — Ambulatory Visit: Payer: Medicare Other | Admitting: Vascular Surgery

## 2016-12-18 ENCOUNTER — Encounter (HOSPITAL_COMMUNITY): Payer: Medicare Other

## 2016-12-30 DIAGNOSIS — N390 Urinary tract infection, site not specified: Secondary | ICD-10-CM | POA: Diagnosis not present

## 2016-12-30 DIAGNOSIS — R310 Gross hematuria: Secondary | ICD-10-CM | POA: Diagnosis not present

## 2016-12-30 DIAGNOSIS — Z6825 Body mass index (BMI) 25.0-25.9, adult: Secondary | ICD-10-CM | POA: Diagnosis not present

## 2016-12-30 DIAGNOSIS — N029 Recurrent and persistent hematuria with unspecified morphologic changes: Secondary | ICD-10-CM | POA: Diagnosis not present

## 2017-01-03 DIAGNOSIS — N029 Recurrent and persistent hematuria with unspecified morphologic changes: Secondary | ICD-10-CM | POA: Diagnosis not present

## 2017-01-03 DIAGNOSIS — E119 Type 2 diabetes mellitus without complications: Secondary | ICD-10-CM | POA: Diagnosis not present

## 2017-01-05 DIAGNOSIS — D62 Acute posthemorrhagic anemia: Secondary | ICD-10-CM | POA: Diagnosis not present

## 2017-01-05 DIAGNOSIS — Z833 Family history of diabetes mellitus: Secondary | ICD-10-CM | POA: Diagnosis not present

## 2017-01-05 DIAGNOSIS — R310 Gross hematuria: Secondary | ICD-10-CM | POA: Diagnosis not present

## 2017-01-05 DIAGNOSIS — R300 Dysuria: Secondary | ICD-10-CM | POA: Diagnosis not present

## 2017-01-05 DIAGNOSIS — Z7722 Contact with and (suspected) exposure to environmental tobacco smoke (acute) (chronic): Secondary | ICD-10-CM | POA: Diagnosis not present

## 2017-01-05 DIAGNOSIS — I2510 Atherosclerotic heart disease of native coronary artery without angina pectoris: Secondary | ICD-10-CM | POA: Diagnosis not present

## 2017-01-05 DIAGNOSIS — E119 Type 2 diabetes mellitus without complications: Secondary | ICD-10-CM | POA: Diagnosis not present

## 2017-01-05 DIAGNOSIS — D649 Anemia, unspecified: Secondary | ICD-10-CM | POA: Diagnosis present

## 2017-01-05 DIAGNOSIS — N400 Enlarged prostate without lower urinary tract symptoms: Secondary | ICD-10-CM | POA: Diagnosis not present

## 2017-01-05 DIAGNOSIS — R42 Dizziness and giddiness: Secondary | ICD-10-CM | POA: Diagnosis not present

## 2017-01-05 DIAGNOSIS — R319 Hematuria, unspecified: Secondary | ICD-10-CM | POA: Diagnosis not present

## 2017-01-11 DIAGNOSIS — N400 Enlarged prostate without lower urinary tract symptoms: Secondary | ICD-10-CM | POA: Diagnosis not present

## 2017-01-11 DIAGNOSIS — Z466 Encounter for fitting and adjustment of urinary device: Secondary | ICD-10-CM | POA: Diagnosis not present

## 2017-01-11 DIAGNOSIS — I10 Essential (primary) hypertension: Secondary | ICD-10-CM | POA: Diagnosis not present

## 2017-01-11 DIAGNOSIS — H401231 Low-tension glaucoma, bilateral, mild stage: Secondary | ICD-10-CM | POA: Diagnosis not present

## 2017-01-11 DIAGNOSIS — E119 Type 2 diabetes mellitus without complications: Secondary | ICD-10-CM | POA: Diagnosis not present

## 2017-01-11 DIAGNOSIS — R310 Gross hematuria: Secondary | ICD-10-CM | POA: Diagnosis not present

## 2017-01-13 DIAGNOSIS — E119 Type 2 diabetes mellitus without complications: Secondary | ICD-10-CM | POA: Diagnosis not present

## 2017-01-13 DIAGNOSIS — N400 Enlarged prostate without lower urinary tract symptoms: Secondary | ICD-10-CM | POA: Diagnosis not present

## 2017-01-13 DIAGNOSIS — Z466 Encounter for fitting and adjustment of urinary device: Secondary | ICD-10-CM | POA: Diagnosis not present

## 2017-01-13 DIAGNOSIS — H401231 Low-tension glaucoma, bilateral, mild stage: Secondary | ICD-10-CM | POA: Diagnosis not present

## 2017-01-13 DIAGNOSIS — I10 Essential (primary) hypertension: Secondary | ICD-10-CM | POA: Diagnosis not present

## 2017-01-13 DIAGNOSIS — R310 Gross hematuria: Secondary | ICD-10-CM | POA: Diagnosis not present

## 2017-01-14 DIAGNOSIS — N400 Enlarged prostate without lower urinary tract symptoms: Secondary | ICD-10-CM | POA: Diagnosis not present

## 2017-01-14 DIAGNOSIS — R310 Gross hematuria: Secondary | ICD-10-CM | POA: Diagnosis not present

## 2017-01-14 DIAGNOSIS — Z466 Encounter for fitting and adjustment of urinary device: Secondary | ICD-10-CM | POA: Diagnosis not present

## 2017-01-14 DIAGNOSIS — R300 Dysuria: Secondary | ICD-10-CM | POA: Diagnosis not present

## 2017-01-16 DIAGNOSIS — N400 Enlarged prostate without lower urinary tract symptoms: Secondary | ICD-10-CM | POA: Diagnosis not present

## 2017-01-17 DIAGNOSIS — N400 Enlarged prostate without lower urinary tract symptoms: Secondary | ICD-10-CM | POA: Diagnosis not present

## 2017-01-17 DIAGNOSIS — E119 Type 2 diabetes mellitus without complications: Secondary | ICD-10-CM | POA: Diagnosis not present

## 2017-01-17 DIAGNOSIS — H401231 Low-tension glaucoma, bilateral, mild stage: Secondary | ICD-10-CM | POA: Diagnosis not present

## 2017-01-17 DIAGNOSIS — R310 Gross hematuria: Secondary | ICD-10-CM | POA: Diagnosis not present

## 2017-01-17 DIAGNOSIS — I10 Essential (primary) hypertension: Secondary | ICD-10-CM | POA: Diagnosis not present

## 2017-01-17 DIAGNOSIS — Z466 Encounter for fitting and adjustment of urinary device: Secondary | ICD-10-CM | POA: Diagnosis not present

## 2017-01-20 DIAGNOSIS — E119 Type 2 diabetes mellitus without complications: Secondary | ICD-10-CM | POA: Diagnosis not present

## 2017-01-20 DIAGNOSIS — H401231 Low-tension glaucoma, bilateral, mild stage: Secondary | ICD-10-CM | POA: Diagnosis not present

## 2017-01-20 DIAGNOSIS — Z466 Encounter for fitting and adjustment of urinary device: Secondary | ICD-10-CM | POA: Diagnosis not present

## 2017-01-20 DIAGNOSIS — I10 Essential (primary) hypertension: Secondary | ICD-10-CM | POA: Diagnosis not present

## 2017-01-20 DIAGNOSIS — N400 Enlarged prostate without lower urinary tract symptoms: Secondary | ICD-10-CM | POA: Diagnosis not present

## 2017-01-20 DIAGNOSIS — R310 Gross hematuria: Secondary | ICD-10-CM | POA: Diagnosis not present

## 2017-01-21 DIAGNOSIS — E119 Type 2 diabetes mellitus without complications: Secondary | ICD-10-CM | POA: Diagnosis not present

## 2017-01-21 DIAGNOSIS — I10 Essential (primary) hypertension: Secondary | ICD-10-CM | POA: Diagnosis not present

## 2017-01-21 DIAGNOSIS — R310 Gross hematuria: Secondary | ICD-10-CM | POA: Diagnosis not present

## 2017-01-21 DIAGNOSIS — Z87440 Personal history of urinary (tract) infections: Secondary | ICD-10-CM | POA: Diagnosis not present

## 2017-01-21 DIAGNOSIS — N400 Enlarged prostate without lower urinary tract symptoms: Secondary | ICD-10-CM | POA: Diagnosis not present

## 2017-01-21 DIAGNOSIS — H401231 Low-tension glaucoma, bilateral, mild stage: Secondary | ICD-10-CM | POA: Diagnosis not present

## 2017-01-21 DIAGNOSIS — Z466 Encounter for fitting and adjustment of urinary device: Secondary | ICD-10-CM | POA: Diagnosis not present

## 2017-01-21 DIAGNOSIS — N390 Urinary tract infection, site not specified: Secondary | ICD-10-CM | POA: Diagnosis not present

## 2017-01-23 DIAGNOSIS — R350 Frequency of micturition: Secondary | ICD-10-CM | POA: Diagnosis not present

## 2017-01-23 DIAGNOSIS — R8290 Unspecified abnormal findings in urine: Secondary | ICD-10-CM | POA: Diagnosis not present

## 2017-01-23 DIAGNOSIS — R339 Retention of urine, unspecified: Secondary | ICD-10-CM | POA: Diagnosis not present

## 2017-01-23 DIAGNOSIS — R3915 Urgency of urination: Secondary | ICD-10-CM | POA: Diagnosis not present

## 2017-01-23 DIAGNOSIS — N401 Enlarged prostate with lower urinary tract symptoms: Secondary | ICD-10-CM | POA: Diagnosis not present

## 2017-01-27 DIAGNOSIS — N400 Enlarged prostate without lower urinary tract symptoms: Secondary | ICD-10-CM | POA: Diagnosis not present

## 2017-01-27 DIAGNOSIS — Z466 Encounter for fitting and adjustment of urinary device: Secondary | ICD-10-CM | POA: Diagnosis not present

## 2017-01-27 DIAGNOSIS — H401231 Low-tension glaucoma, bilateral, mild stage: Secondary | ICD-10-CM | POA: Diagnosis not present

## 2017-01-27 DIAGNOSIS — R310 Gross hematuria: Secondary | ICD-10-CM | POA: Diagnosis not present

## 2017-01-27 DIAGNOSIS — I10 Essential (primary) hypertension: Secondary | ICD-10-CM | POA: Diagnosis not present

## 2017-01-27 DIAGNOSIS — E119 Type 2 diabetes mellitus without complications: Secondary | ICD-10-CM | POA: Diagnosis not present

## 2017-01-30 DIAGNOSIS — R338 Other retention of urine: Secondary | ICD-10-CM | POA: Diagnosis not present

## 2017-01-30 DIAGNOSIS — R339 Retention of urine, unspecified: Secondary | ICD-10-CM | POA: Diagnosis not present

## 2017-01-30 DIAGNOSIS — N401 Enlarged prostate with lower urinary tract symptoms: Secondary | ICD-10-CM | POA: Diagnosis not present

## 2017-01-30 DIAGNOSIS — N400 Enlarged prostate without lower urinary tract symptoms: Secondary | ICD-10-CM | POA: Diagnosis not present

## 2017-01-31 DIAGNOSIS — H401231 Low-tension glaucoma, bilateral, mild stage: Secondary | ICD-10-CM | POA: Diagnosis not present

## 2017-01-31 DIAGNOSIS — I10 Essential (primary) hypertension: Secondary | ICD-10-CM | POA: Diagnosis not present

## 2017-01-31 DIAGNOSIS — R310 Gross hematuria: Secondary | ICD-10-CM | POA: Diagnosis not present

## 2017-01-31 DIAGNOSIS — N400 Enlarged prostate without lower urinary tract symptoms: Secondary | ICD-10-CM | POA: Diagnosis not present

## 2017-01-31 DIAGNOSIS — Z466 Encounter for fitting and adjustment of urinary device: Secondary | ICD-10-CM | POA: Diagnosis not present

## 2017-01-31 DIAGNOSIS — E119 Type 2 diabetes mellitus without complications: Secondary | ICD-10-CM | POA: Diagnosis not present

## 2017-02-03 DIAGNOSIS — E119 Type 2 diabetes mellitus without complications: Secondary | ICD-10-CM | POA: Diagnosis not present

## 2017-02-03 DIAGNOSIS — I10 Essential (primary) hypertension: Secondary | ICD-10-CM | POA: Diagnosis not present

## 2017-02-03 DIAGNOSIS — Z466 Encounter for fitting and adjustment of urinary device: Secondary | ICD-10-CM | POA: Diagnosis not present

## 2017-02-03 DIAGNOSIS — N400 Enlarged prostate without lower urinary tract symptoms: Secondary | ICD-10-CM | POA: Diagnosis not present

## 2017-02-03 DIAGNOSIS — R310 Gross hematuria: Secondary | ICD-10-CM | POA: Diagnosis not present

## 2017-02-03 DIAGNOSIS — H401231 Low-tension glaucoma, bilateral, mild stage: Secondary | ICD-10-CM | POA: Diagnosis not present

## 2017-02-10 DIAGNOSIS — Z466 Encounter for fitting and adjustment of urinary device: Secondary | ICD-10-CM | POA: Diagnosis not present

## 2017-02-10 DIAGNOSIS — R310 Gross hematuria: Secondary | ICD-10-CM | POA: Diagnosis not present

## 2017-02-12 DIAGNOSIS — I10 Essential (primary) hypertension: Secondary | ICD-10-CM | POA: Diagnosis not present

## 2017-02-12 DIAGNOSIS — E119 Type 2 diabetes mellitus without complications: Secondary | ICD-10-CM | POA: Diagnosis not present

## 2017-02-12 DIAGNOSIS — I2510 Atherosclerotic heart disease of native coronary artery without angina pectoris: Secondary | ICD-10-CM | POA: Diagnosis not present

## 2017-02-12 DIAGNOSIS — I429 Cardiomyopathy, unspecified: Secondary | ICD-10-CM | POA: Diagnosis not present

## 2017-02-12 DIAGNOSIS — I499 Cardiac arrhythmia, unspecified: Secondary | ICD-10-CM | POA: Diagnosis not present

## 2017-02-13 DIAGNOSIS — Z466 Encounter for fitting and adjustment of urinary device: Secondary | ICD-10-CM | POA: Diagnosis not present

## 2017-02-13 DIAGNOSIS — E119 Type 2 diabetes mellitus without complications: Secondary | ICD-10-CM | POA: Diagnosis not present

## 2017-02-13 DIAGNOSIS — N400 Enlarged prostate without lower urinary tract symptoms: Secondary | ICD-10-CM | POA: Diagnosis not present

## 2017-02-13 DIAGNOSIS — H401231 Low-tension glaucoma, bilateral, mild stage: Secondary | ICD-10-CM | POA: Diagnosis not present

## 2017-02-13 DIAGNOSIS — I10 Essential (primary) hypertension: Secondary | ICD-10-CM | POA: Diagnosis not present

## 2017-02-13 DIAGNOSIS — R310 Gross hematuria: Secondary | ICD-10-CM | POA: Diagnosis not present

## 2017-02-18 DIAGNOSIS — R310 Gross hematuria: Secondary | ICD-10-CM | POA: Diagnosis not present

## 2017-02-18 DIAGNOSIS — I10 Essential (primary) hypertension: Secondary | ICD-10-CM | POA: Diagnosis not present

## 2017-02-18 DIAGNOSIS — H401231 Low-tension glaucoma, bilateral, mild stage: Secondary | ICD-10-CM | POA: Diagnosis not present

## 2017-02-18 DIAGNOSIS — Z466 Encounter for fitting and adjustment of urinary device: Secondary | ICD-10-CM | POA: Diagnosis not present

## 2017-02-18 DIAGNOSIS — N400 Enlarged prostate without lower urinary tract symptoms: Secondary | ICD-10-CM | POA: Diagnosis not present

## 2017-02-18 DIAGNOSIS — E119 Type 2 diabetes mellitus without complications: Secondary | ICD-10-CM | POA: Diagnosis not present

## 2017-02-25 DIAGNOSIS — I10 Essential (primary) hypertension: Secondary | ICD-10-CM | POA: Diagnosis not present

## 2017-02-25 DIAGNOSIS — Z466 Encounter for fitting and adjustment of urinary device: Secondary | ICD-10-CM | POA: Diagnosis not present

## 2017-02-25 DIAGNOSIS — H401231 Low-tension glaucoma, bilateral, mild stage: Secondary | ICD-10-CM | POA: Diagnosis not present

## 2017-02-25 DIAGNOSIS — E119 Type 2 diabetes mellitus without complications: Secondary | ICD-10-CM | POA: Diagnosis not present

## 2017-02-25 DIAGNOSIS — R310 Gross hematuria: Secondary | ICD-10-CM | POA: Diagnosis not present

## 2017-02-25 DIAGNOSIS — N400 Enlarged prostate without lower urinary tract symptoms: Secondary | ICD-10-CM | POA: Diagnosis not present

## 2017-03-03 DIAGNOSIS — M25561 Pain in right knee: Secondary | ICD-10-CM | POA: Diagnosis not present

## 2017-03-03 DIAGNOSIS — M170 Bilateral primary osteoarthritis of knee: Secondary | ICD-10-CM | POA: Diagnosis not present

## 2017-03-03 DIAGNOSIS — M25562 Pain in left knee: Secondary | ICD-10-CM | POA: Diagnosis not present

## 2017-03-04 DIAGNOSIS — E1151 Type 2 diabetes mellitus with diabetic peripheral angiopathy without gangrene: Secondary | ICD-10-CM | POA: Diagnosis not present

## 2017-03-04 DIAGNOSIS — I739 Peripheral vascular disease, unspecified: Secondary | ICD-10-CM | POA: Diagnosis not present

## 2017-03-04 DIAGNOSIS — E785 Hyperlipidemia, unspecified: Secondary | ICD-10-CM | POA: Diagnosis not present

## 2017-03-04 DIAGNOSIS — E119 Type 2 diabetes mellitus without complications: Secondary | ICD-10-CM | POA: Diagnosis not present

## 2017-03-04 DIAGNOSIS — H34212 Partial retinal artery occlusion, left eye: Secondary | ICD-10-CM | POA: Diagnosis not present

## 2017-03-04 DIAGNOSIS — H353131 Nonexudative age-related macular degeneration, bilateral, early dry stage: Secondary | ICD-10-CM | POA: Diagnosis not present

## 2017-03-04 DIAGNOSIS — Z79899 Other long term (current) drug therapy: Secondary | ICD-10-CM | POA: Diagnosis not present

## 2017-03-05 DIAGNOSIS — N400 Enlarged prostate without lower urinary tract symptoms: Secondary | ICD-10-CM | POA: Diagnosis not present

## 2017-03-05 DIAGNOSIS — I10 Essential (primary) hypertension: Secondary | ICD-10-CM | POA: Diagnosis not present

## 2017-03-05 DIAGNOSIS — H401231 Low-tension glaucoma, bilateral, mild stage: Secondary | ICD-10-CM | POA: Diagnosis not present

## 2017-03-05 DIAGNOSIS — E119 Type 2 diabetes mellitus without complications: Secondary | ICD-10-CM | POA: Diagnosis not present

## 2017-03-05 DIAGNOSIS — R310 Gross hematuria: Secondary | ICD-10-CM | POA: Diagnosis not present

## 2017-03-05 DIAGNOSIS — Z466 Encounter for fitting and adjustment of urinary device: Secondary | ICD-10-CM | POA: Diagnosis not present

## 2017-03-10 DIAGNOSIS — R310 Gross hematuria: Secondary | ICD-10-CM | POA: Diagnosis not present

## 2017-03-10 DIAGNOSIS — I10 Essential (primary) hypertension: Secondary | ICD-10-CM | POA: Diagnosis not present

## 2017-03-10 DIAGNOSIS — N400 Enlarged prostate without lower urinary tract symptoms: Secondary | ICD-10-CM | POA: Diagnosis not present

## 2017-03-10 DIAGNOSIS — E119 Type 2 diabetes mellitus without complications: Secondary | ICD-10-CM | POA: Diagnosis not present

## 2017-03-10 DIAGNOSIS — H401231 Low-tension glaucoma, bilateral, mild stage: Secondary | ICD-10-CM | POA: Diagnosis not present

## 2017-03-10 DIAGNOSIS — Z466 Encounter for fitting and adjustment of urinary device: Secondary | ICD-10-CM | POA: Diagnosis not present

## 2017-03-11 DIAGNOSIS — M170 Bilateral primary osteoarthritis of knee: Secondary | ICD-10-CM | POA: Diagnosis not present

## 2017-03-11 DIAGNOSIS — M25561 Pain in right knee: Secondary | ICD-10-CM | POA: Diagnosis not present

## 2017-03-11 DIAGNOSIS — M25562 Pain in left knee: Secondary | ICD-10-CM | POA: Diagnosis not present

## 2017-03-25 DIAGNOSIS — M25561 Pain in right knee: Secondary | ICD-10-CM | POA: Diagnosis not present

## 2017-03-25 DIAGNOSIS — M170 Bilateral primary osteoarthritis of knee: Secondary | ICD-10-CM | POA: Diagnosis not present

## 2017-03-25 DIAGNOSIS — M25562 Pain in left knee: Secondary | ICD-10-CM | POA: Diagnosis not present

## 2017-04-22 DIAGNOSIS — S51802A Unspecified open wound of left forearm, initial encounter: Secondary | ICD-10-CM | POA: Diagnosis not present

## 2017-04-28 DIAGNOSIS — Z9181 History of falling: Secondary | ICD-10-CM | POA: Diagnosis not present

## 2017-04-28 DIAGNOSIS — H353131 Nonexudative age-related macular degeneration, bilateral, early dry stage: Secondary | ICD-10-CM | POA: Diagnosis not present

## 2017-04-28 DIAGNOSIS — E785 Hyperlipidemia, unspecified: Secondary | ICD-10-CM | POA: Diagnosis not present

## 2017-04-28 DIAGNOSIS — K219 Gastro-esophageal reflux disease without esophagitis: Secondary | ICD-10-CM | POA: Diagnosis not present

## 2017-04-28 DIAGNOSIS — M6281 Muscle weakness (generalized): Secondary | ICD-10-CM | POA: Diagnosis not present

## 2017-04-28 DIAGNOSIS — I10 Essential (primary) hypertension: Secondary | ICD-10-CM | POA: Diagnosis not present

## 2017-04-28 DIAGNOSIS — K589 Irritable bowel syndrome without diarrhea: Secondary | ICD-10-CM | POA: Diagnosis not present

## 2017-04-28 DIAGNOSIS — H401231 Low-tension glaucoma, bilateral, mild stage: Secondary | ICD-10-CM | POA: Diagnosis not present

## 2017-04-28 DIAGNOSIS — E1151 Type 2 diabetes mellitus with diabetic peripheral angiopathy without gangrene: Secondary | ICD-10-CM | POA: Diagnosis not present

## 2017-04-30 DIAGNOSIS — E785 Hyperlipidemia, unspecified: Secondary | ICD-10-CM | POA: Diagnosis not present

## 2017-04-30 DIAGNOSIS — E1151 Type 2 diabetes mellitus with diabetic peripheral angiopathy without gangrene: Secondary | ICD-10-CM | POA: Diagnosis not present

## 2017-04-30 DIAGNOSIS — K589 Irritable bowel syndrome without diarrhea: Secondary | ICD-10-CM | POA: Diagnosis not present

## 2017-04-30 DIAGNOSIS — M6281 Muscle weakness (generalized): Secondary | ICD-10-CM | POA: Diagnosis not present

## 2017-04-30 DIAGNOSIS — I10 Essential (primary) hypertension: Secondary | ICD-10-CM | POA: Diagnosis not present

## 2017-04-30 DIAGNOSIS — K219 Gastro-esophageal reflux disease without esophagitis: Secondary | ICD-10-CM | POA: Diagnosis not present

## 2017-05-05 DIAGNOSIS — M6281 Muscle weakness (generalized): Secondary | ICD-10-CM | POA: Diagnosis not present

## 2017-05-05 DIAGNOSIS — E1151 Type 2 diabetes mellitus with diabetic peripheral angiopathy without gangrene: Secondary | ICD-10-CM | POA: Diagnosis not present

## 2017-05-05 DIAGNOSIS — K589 Irritable bowel syndrome without diarrhea: Secondary | ICD-10-CM | POA: Diagnosis not present

## 2017-05-05 DIAGNOSIS — I10 Essential (primary) hypertension: Secondary | ICD-10-CM | POA: Diagnosis not present

## 2017-05-05 DIAGNOSIS — K219 Gastro-esophageal reflux disease without esophagitis: Secondary | ICD-10-CM | POA: Diagnosis not present

## 2017-05-05 DIAGNOSIS — E785 Hyperlipidemia, unspecified: Secondary | ICD-10-CM | POA: Diagnosis not present

## 2017-05-07 DIAGNOSIS — E785 Hyperlipidemia, unspecified: Secondary | ICD-10-CM | POA: Diagnosis not present

## 2017-05-07 DIAGNOSIS — K219 Gastro-esophageal reflux disease without esophagitis: Secondary | ICD-10-CM | POA: Diagnosis not present

## 2017-05-07 DIAGNOSIS — K589 Irritable bowel syndrome without diarrhea: Secondary | ICD-10-CM | POA: Diagnosis not present

## 2017-05-07 DIAGNOSIS — E1151 Type 2 diabetes mellitus with diabetic peripheral angiopathy without gangrene: Secondary | ICD-10-CM | POA: Diagnosis not present

## 2017-05-07 DIAGNOSIS — M6281 Muscle weakness (generalized): Secondary | ICD-10-CM | POA: Diagnosis not present

## 2017-05-07 DIAGNOSIS — I10 Essential (primary) hypertension: Secondary | ICD-10-CM | POA: Diagnosis not present

## 2017-05-09 DIAGNOSIS — E1151 Type 2 diabetes mellitus with diabetic peripheral angiopathy without gangrene: Secondary | ICD-10-CM | POA: Diagnosis not present

## 2017-05-09 DIAGNOSIS — I10 Essential (primary) hypertension: Secondary | ICD-10-CM | POA: Diagnosis not present

## 2017-05-09 DIAGNOSIS — M6281 Muscle weakness (generalized): Secondary | ICD-10-CM | POA: Diagnosis not present

## 2017-05-09 DIAGNOSIS — K219 Gastro-esophageal reflux disease without esophagitis: Secondary | ICD-10-CM | POA: Diagnosis not present

## 2017-05-09 DIAGNOSIS — E785 Hyperlipidemia, unspecified: Secondary | ICD-10-CM | POA: Diagnosis not present

## 2017-05-09 DIAGNOSIS — K589 Irritable bowel syndrome without diarrhea: Secondary | ICD-10-CM | POA: Diagnosis not present

## 2017-05-12 DIAGNOSIS — K589 Irritable bowel syndrome without diarrhea: Secondary | ICD-10-CM | POA: Diagnosis not present

## 2017-05-12 DIAGNOSIS — I10 Essential (primary) hypertension: Secondary | ICD-10-CM | POA: Diagnosis not present

## 2017-05-12 DIAGNOSIS — E785 Hyperlipidemia, unspecified: Secondary | ICD-10-CM | POA: Diagnosis not present

## 2017-05-12 DIAGNOSIS — K219 Gastro-esophageal reflux disease without esophagitis: Secondary | ICD-10-CM | POA: Diagnosis not present

## 2017-05-12 DIAGNOSIS — M6281 Muscle weakness (generalized): Secondary | ICD-10-CM | POA: Diagnosis not present

## 2017-05-12 DIAGNOSIS — E1151 Type 2 diabetes mellitus with diabetic peripheral angiopathy without gangrene: Secondary | ICD-10-CM | POA: Diagnosis not present

## 2017-05-14 DIAGNOSIS — E1151 Type 2 diabetes mellitus with diabetic peripheral angiopathy without gangrene: Secondary | ICD-10-CM | POA: Diagnosis not present

## 2017-05-14 DIAGNOSIS — K219 Gastro-esophageal reflux disease without esophagitis: Secondary | ICD-10-CM | POA: Diagnosis not present

## 2017-05-14 DIAGNOSIS — M6281 Muscle weakness (generalized): Secondary | ICD-10-CM | POA: Diagnosis not present

## 2017-05-14 DIAGNOSIS — E785 Hyperlipidemia, unspecified: Secondary | ICD-10-CM | POA: Diagnosis not present

## 2017-05-14 DIAGNOSIS — I10 Essential (primary) hypertension: Secondary | ICD-10-CM | POA: Diagnosis not present

## 2017-05-14 DIAGNOSIS — K589 Irritable bowel syndrome without diarrhea: Secondary | ICD-10-CM | POA: Diagnosis not present

## 2017-05-19 DIAGNOSIS — I6522 Occlusion and stenosis of left carotid artery: Secondary | ICD-10-CM | POA: Diagnosis not present

## 2017-05-19 DIAGNOSIS — I6529 Occlusion and stenosis of unspecified carotid artery: Secondary | ICD-10-CM | POA: Diagnosis not present

## 2017-05-20 DIAGNOSIS — K219 Gastro-esophageal reflux disease without esophagitis: Secondary | ICD-10-CM | POA: Diagnosis not present

## 2017-05-20 DIAGNOSIS — K589 Irritable bowel syndrome without diarrhea: Secondary | ICD-10-CM | POA: Diagnosis not present

## 2017-05-20 DIAGNOSIS — M6281 Muscle weakness (generalized): Secondary | ICD-10-CM | POA: Diagnosis not present

## 2017-05-20 DIAGNOSIS — E785 Hyperlipidemia, unspecified: Secondary | ICD-10-CM | POA: Diagnosis not present

## 2017-05-20 DIAGNOSIS — E1151 Type 2 diabetes mellitus with diabetic peripheral angiopathy without gangrene: Secondary | ICD-10-CM | POA: Diagnosis not present

## 2017-05-20 DIAGNOSIS — I10 Essential (primary) hypertension: Secondary | ICD-10-CM | POA: Diagnosis not present

## 2017-05-22 DIAGNOSIS — K589 Irritable bowel syndrome without diarrhea: Secondary | ICD-10-CM | POA: Diagnosis not present

## 2017-05-22 DIAGNOSIS — E1151 Type 2 diabetes mellitus with diabetic peripheral angiopathy without gangrene: Secondary | ICD-10-CM | POA: Diagnosis not present

## 2017-05-22 DIAGNOSIS — M6281 Muscle weakness (generalized): Secondary | ICD-10-CM | POA: Diagnosis not present

## 2017-05-22 DIAGNOSIS — K219 Gastro-esophageal reflux disease without esophagitis: Secondary | ICD-10-CM | POA: Diagnosis not present

## 2017-05-22 DIAGNOSIS — E785 Hyperlipidemia, unspecified: Secondary | ICD-10-CM | POA: Diagnosis not present

## 2017-05-22 DIAGNOSIS — I10 Essential (primary) hypertension: Secondary | ICD-10-CM | POA: Diagnosis not present

## 2017-05-27 DIAGNOSIS — K219 Gastro-esophageal reflux disease without esophagitis: Secondary | ICD-10-CM | POA: Diagnosis not present

## 2017-05-27 DIAGNOSIS — I10 Essential (primary) hypertension: Secondary | ICD-10-CM | POA: Diagnosis not present

## 2017-05-27 DIAGNOSIS — E1151 Type 2 diabetes mellitus with diabetic peripheral angiopathy without gangrene: Secondary | ICD-10-CM | POA: Diagnosis not present

## 2017-05-27 DIAGNOSIS — M6281 Muscle weakness (generalized): Secondary | ICD-10-CM | POA: Diagnosis not present

## 2017-05-27 DIAGNOSIS — K589 Irritable bowel syndrome without diarrhea: Secondary | ICD-10-CM | POA: Diagnosis not present

## 2017-05-27 DIAGNOSIS — E785 Hyperlipidemia, unspecified: Secondary | ICD-10-CM | POA: Diagnosis not present

## 2017-05-29 DIAGNOSIS — E1151 Type 2 diabetes mellitus with diabetic peripheral angiopathy without gangrene: Secondary | ICD-10-CM | POA: Diagnosis not present

## 2017-05-29 DIAGNOSIS — E785 Hyperlipidemia, unspecified: Secondary | ICD-10-CM | POA: Diagnosis not present

## 2017-05-29 DIAGNOSIS — K219 Gastro-esophageal reflux disease without esophagitis: Secondary | ICD-10-CM | POA: Diagnosis not present

## 2017-05-29 DIAGNOSIS — M6281 Muscle weakness (generalized): Secondary | ICD-10-CM | POA: Diagnosis not present

## 2017-05-29 DIAGNOSIS — I10 Essential (primary) hypertension: Secondary | ICD-10-CM | POA: Diagnosis not present

## 2017-05-29 DIAGNOSIS — K589 Irritable bowel syndrome without diarrhea: Secondary | ICD-10-CM | POA: Diagnosis not present

## 2017-06-02 DIAGNOSIS — M6281 Muscle weakness (generalized): Secondary | ICD-10-CM | POA: Diagnosis not present

## 2017-06-02 DIAGNOSIS — K589 Irritable bowel syndrome without diarrhea: Secondary | ICD-10-CM | POA: Diagnosis not present

## 2017-06-02 DIAGNOSIS — K219 Gastro-esophageal reflux disease without esophagitis: Secondary | ICD-10-CM | POA: Diagnosis not present

## 2017-06-02 DIAGNOSIS — E785 Hyperlipidemia, unspecified: Secondary | ICD-10-CM | POA: Diagnosis not present

## 2017-06-02 DIAGNOSIS — I10 Essential (primary) hypertension: Secondary | ICD-10-CM | POA: Diagnosis not present

## 2017-06-02 DIAGNOSIS — E1151 Type 2 diabetes mellitus with diabetic peripheral angiopathy without gangrene: Secondary | ICD-10-CM | POA: Diagnosis not present

## 2017-06-04 DIAGNOSIS — E1151 Type 2 diabetes mellitus with diabetic peripheral angiopathy without gangrene: Secondary | ICD-10-CM | POA: Diagnosis not present

## 2017-06-04 DIAGNOSIS — I10 Essential (primary) hypertension: Secondary | ICD-10-CM | POA: Diagnosis not present

## 2017-06-04 DIAGNOSIS — M6281 Muscle weakness (generalized): Secondary | ICD-10-CM | POA: Diagnosis not present

## 2017-06-04 DIAGNOSIS — K589 Irritable bowel syndrome without diarrhea: Secondary | ICD-10-CM | POA: Diagnosis not present

## 2017-06-04 DIAGNOSIS — K219 Gastro-esophageal reflux disease without esophagitis: Secondary | ICD-10-CM | POA: Diagnosis not present

## 2017-06-04 DIAGNOSIS — E785 Hyperlipidemia, unspecified: Secondary | ICD-10-CM | POA: Diagnosis not present

## 2017-06-08 DIAGNOSIS — H1820 Unspecified corneal edema: Secondary | ICD-10-CM | POA: Diagnosis not present

## 2017-06-10 DIAGNOSIS — H1820 Unspecified corneal edema: Secondary | ICD-10-CM | POA: Diagnosis not present

## 2017-06-11 DIAGNOSIS — I499 Cardiac arrhythmia, unspecified: Secondary | ICD-10-CM | POA: Diagnosis not present

## 2017-06-11 DIAGNOSIS — I429 Cardiomyopathy, unspecified: Secondary | ICD-10-CM | POA: Diagnosis not present

## 2017-06-11 DIAGNOSIS — I2510 Atherosclerotic heart disease of native coronary artery without angina pectoris: Secondary | ICD-10-CM | POA: Diagnosis not present

## 2017-06-11 DIAGNOSIS — I10 Essential (primary) hypertension: Secondary | ICD-10-CM | POA: Diagnosis not present

## 2017-06-11 DIAGNOSIS — E119 Type 2 diabetes mellitus without complications: Secondary | ICD-10-CM | POA: Diagnosis not present

## 2017-06-12 DIAGNOSIS — I10 Essential (primary) hypertension: Secondary | ICD-10-CM | POA: Diagnosis not present

## 2017-06-12 DIAGNOSIS — K219 Gastro-esophageal reflux disease without esophagitis: Secondary | ICD-10-CM | POA: Diagnosis not present

## 2017-06-12 DIAGNOSIS — M6281 Muscle weakness (generalized): Secondary | ICD-10-CM | POA: Diagnosis not present

## 2017-06-12 DIAGNOSIS — E1151 Type 2 diabetes mellitus with diabetic peripheral angiopathy without gangrene: Secondary | ICD-10-CM | POA: Diagnosis not present

## 2017-06-12 DIAGNOSIS — E785 Hyperlipidemia, unspecified: Secondary | ICD-10-CM | POA: Diagnosis not present

## 2017-06-12 DIAGNOSIS — K589 Irritable bowel syndrome without diarrhea: Secondary | ICD-10-CM | POA: Diagnosis not present

## 2017-06-13 DIAGNOSIS — E785 Hyperlipidemia, unspecified: Secondary | ICD-10-CM | POA: Diagnosis not present

## 2017-06-13 DIAGNOSIS — E1151 Type 2 diabetes mellitus with diabetic peripheral angiopathy without gangrene: Secondary | ICD-10-CM | POA: Diagnosis not present

## 2017-06-13 DIAGNOSIS — I10 Essential (primary) hypertension: Secondary | ICD-10-CM | POA: Diagnosis not present

## 2017-06-13 DIAGNOSIS — K589 Irritable bowel syndrome without diarrhea: Secondary | ICD-10-CM | POA: Diagnosis not present

## 2017-06-13 DIAGNOSIS — M6281 Muscle weakness (generalized): Secondary | ICD-10-CM | POA: Diagnosis not present

## 2017-06-13 DIAGNOSIS — K219 Gastro-esophageal reflux disease without esophagitis: Secondary | ICD-10-CM | POA: Diagnosis not present

## 2017-07-08 DIAGNOSIS — M25562 Pain in left knee: Secondary | ICD-10-CM | POA: Diagnosis not present

## 2017-07-08 DIAGNOSIS — M25561 Pain in right knee: Secondary | ICD-10-CM | POA: Diagnosis not present

## 2017-07-08 DIAGNOSIS — M170 Bilateral primary osteoarthritis of knee: Secondary | ICD-10-CM | POA: Diagnosis not present

## 2017-07-15 DIAGNOSIS — M170 Bilateral primary osteoarthritis of knee: Secondary | ICD-10-CM | POA: Diagnosis not present

## 2017-07-15 DIAGNOSIS — M25561 Pain in right knee: Secondary | ICD-10-CM | POA: Diagnosis not present

## 2017-07-15 DIAGNOSIS — M25562 Pain in left knee: Secondary | ICD-10-CM | POA: Diagnosis not present

## 2017-07-22 DIAGNOSIS — M25561 Pain in right knee: Secondary | ICD-10-CM | POA: Diagnosis not present

## 2017-07-22 DIAGNOSIS — M170 Bilateral primary osteoarthritis of knee: Secondary | ICD-10-CM | POA: Diagnosis not present

## 2017-07-22 DIAGNOSIS — M25562 Pain in left knee: Secondary | ICD-10-CM | POA: Diagnosis not present

## 2017-07-24 DIAGNOSIS — H401231 Low-tension glaucoma, bilateral, mild stage: Secondary | ICD-10-CM | POA: Diagnosis not present

## 2017-08-15 ENCOUNTER — Other Ambulatory Visit: Payer: Self-pay

## 2017-09-25 DIAGNOSIS — Z23 Encounter for immunization: Secondary | ICD-10-CM | POA: Diagnosis not present

## 2017-09-25 DIAGNOSIS — K219 Gastro-esophageal reflux disease without esophagitis: Secondary | ICD-10-CM | POA: Diagnosis not present

## 2017-09-25 DIAGNOSIS — Z0000 Encounter for general adult medical examination without abnormal findings: Secondary | ICD-10-CM | POA: Diagnosis not present

## 2017-09-25 DIAGNOSIS — E785 Hyperlipidemia, unspecified: Secondary | ICD-10-CM | POA: Diagnosis not present

## 2017-09-25 DIAGNOSIS — E1151 Type 2 diabetes mellitus with diabetic peripheral angiopathy without gangrene: Secondary | ICD-10-CM | POA: Diagnosis not present

## 2017-09-25 DIAGNOSIS — M791 Myalgia, unspecified site: Secondary | ICD-10-CM | POA: Diagnosis not present

## 2017-09-25 DIAGNOSIS — T466X5A Adverse effect of antihyperlipidemic and antiarteriosclerotic drugs, initial encounter: Secondary | ICD-10-CM | POA: Diagnosis not present

## 2017-09-25 DIAGNOSIS — Z79899 Other long term (current) drug therapy: Secondary | ICD-10-CM | POA: Diagnosis not present

## 2017-10-15 DIAGNOSIS — I2510 Atherosclerotic heart disease of native coronary artery without angina pectoris: Secondary | ICD-10-CM | POA: Diagnosis not present

## 2017-10-15 DIAGNOSIS — R9431 Abnormal electrocardiogram [ECG] [EKG]: Secondary | ICD-10-CM | POA: Diagnosis not present

## 2017-10-15 DIAGNOSIS — I429 Cardiomyopathy, unspecified: Secondary | ICD-10-CM | POA: Diagnosis not present

## 2017-10-15 DIAGNOSIS — R5383 Other fatigue: Secondary | ICD-10-CM | POA: Diagnosis not present

## 2017-10-15 DIAGNOSIS — R0609 Other forms of dyspnea: Secondary | ICD-10-CM | POA: Diagnosis not present

## 2017-10-15 DIAGNOSIS — I10 Essential (primary) hypertension: Secondary | ICD-10-CM | POA: Diagnosis not present

## 2017-10-15 DIAGNOSIS — E119 Type 2 diabetes mellitus without complications: Secondary | ICD-10-CM | POA: Diagnosis not present

## 2017-11-12 DIAGNOSIS — I2510 Atherosclerotic heart disease of native coronary artery without angina pectoris: Secondary | ICD-10-CM | POA: Diagnosis not present

## 2017-11-12 DIAGNOSIS — I429 Cardiomyopathy, unspecified: Secondary | ICD-10-CM | POA: Diagnosis not present

## 2017-11-12 DIAGNOSIS — R0609 Other forms of dyspnea: Secondary | ICD-10-CM | POA: Diagnosis not present

## 2017-11-12 DIAGNOSIS — R931 Abnormal findings on diagnostic imaging of heart and coronary circulation: Secondary | ICD-10-CM | POA: Diagnosis not present

## 2017-11-12 DIAGNOSIS — E119 Type 2 diabetes mellitus without complications: Secondary | ICD-10-CM | POA: Diagnosis not present

## 2017-11-12 DIAGNOSIS — R9431 Abnormal electrocardiogram [ECG] [EKG]: Secondary | ICD-10-CM | POA: Diagnosis not present

## 2017-11-12 DIAGNOSIS — R5383 Other fatigue: Secondary | ICD-10-CM | POA: Diagnosis not present

## 2017-11-12 DIAGNOSIS — I10 Essential (primary) hypertension: Secondary | ICD-10-CM | POA: Diagnosis not present

## 2017-11-12 DIAGNOSIS — I5189 Other ill-defined heart diseases: Secondary | ICD-10-CM | POA: Diagnosis not present

## 2017-11-27 DIAGNOSIS — I6523 Occlusion and stenosis of bilateral carotid arteries: Secondary | ICD-10-CM | POA: Diagnosis not present

## 2017-12-04 ENCOUNTER — Other Ambulatory Visit: Payer: Self-pay

## 2018-02-10 DIAGNOSIS — N401 Enlarged prostate with lower urinary tract symptoms: Secondary | ICD-10-CM | POA: Diagnosis not present

## 2018-02-10 DIAGNOSIS — Z87430 Personal history of prostatic dysplasia: Secondary | ICD-10-CM | POA: Diagnosis not present

## 2018-02-10 DIAGNOSIS — N400 Enlarged prostate without lower urinary tract symptoms: Secondary | ICD-10-CM | POA: Diagnosis not present

## 2018-02-10 DIAGNOSIS — R338 Other retention of urine: Secondary | ICD-10-CM | POA: Diagnosis not present

## 2018-02-10 DIAGNOSIS — R339 Retention of urine, unspecified: Secondary | ICD-10-CM | POA: Diagnosis not present

## 2018-02-11 DIAGNOSIS — E1139 Type 2 diabetes mellitus with other diabetic ophthalmic complication: Secondary | ICD-10-CM | POA: Diagnosis not present

## 2018-02-11 DIAGNOSIS — I10 Essential (primary) hypertension: Secondary | ICD-10-CM | POA: Diagnosis not present

## 2018-02-11 DIAGNOSIS — R5383 Other fatigue: Secondary | ICD-10-CM | POA: Diagnosis not present

## 2018-02-11 DIAGNOSIS — I4949 Other premature depolarization: Secondary | ICD-10-CM | POA: Diagnosis not present

## 2018-02-11 DIAGNOSIS — R931 Abnormal findings on diagnostic imaging of heart and coronary circulation: Secondary | ICD-10-CM | POA: Diagnosis not present

## 2018-02-11 DIAGNOSIS — R9431 Abnormal electrocardiogram [ECG] [EKG]: Secondary | ICD-10-CM | POA: Diagnosis not present

## 2018-02-11 DIAGNOSIS — I2510 Atherosclerotic heart disease of native coronary artery without angina pectoris: Secondary | ICD-10-CM | POA: Diagnosis not present

## 2018-02-11 DIAGNOSIS — I5189 Other ill-defined heart diseases: Secondary | ICD-10-CM | POA: Diagnosis not present

## 2018-03-20 DIAGNOSIS — L089 Local infection of the skin and subcutaneous tissue, unspecified: Secondary | ICD-10-CM | POA: Diagnosis not present

## 2018-03-20 DIAGNOSIS — R21 Rash and other nonspecific skin eruption: Secondary | ICD-10-CM | POA: Diagnosis not present

## 2018-05-12 DIAGNOSIS — E1151 Type 2 diabetes mellitus with diabetic peripheral angiopathy without gangrene: Secondary | ICD-10-CM | POA: Diagnosis not present

## 2018-05-12 DIAGNOSIS — N138 Other obstructive and reflux uropathy: Secondary | ICD-10-CM | POA: Diagnosis not present

## 2018-05-12 DIAGNOSIS — R21 Rash and other nonspecific skin eruption: Secondary | ICD-10-CM | POA: Diagnosis not present

## 2018-05-12 DIAGNOSIS — N401 Enlarged prostate with lower urinary tract symptoms: Secondary | ICD-10-CM | POA: Diagnosis not present

## 2018-05-29 DIAGNOSIS — I6523 Occlusion and stenosis of bilateral carotid arteries: Secondary | ICD-10-CM | POA: Diagnosis not present

## 2018-05-29 DIAGNOSIS — I6522 Occlusion and stenosis of left carotid artery: Secondary | ICD-10-CM | POA: Diagnosis not present

## 2018-06-11 DIAGNOSIS — B86 Scabies: Secondary | ICD-10-CM | POA: Diagnosis not present

## 2018-06-15 DIAGNOSIS — I2510 Atherosclerotic heart disease of native coronary artery without angina pectoris: Secondary | ICD-10-CM | POA: Diagnosis not present

## 2018-06-15 DIAGNOSIS — R931 Abnormal findings on diagnostic imaging of heart and coronary circulation: Secondary | ICD-10-CM | POA: Diagnosis not present

## 2018-06-15 DIAGNOSIS — I429 Cardiomyopathy, unspecified: Secondary | ICD-10-CM | POA: Diagnosis not present

## 2018-06-15 DIAGNOSIS — I4949 Other premature depolarization: Secondary | ICD-10-CM | POA: Diagnosis not present

## 2018-06-15 DIAGNOSIS — R9431 Abnormal electrocardiogram [ECG] [EKG]: Secondary | ICD-10-CM | POA: Diagnosis not present

## 2018-06-15 DIAGNOSIS — R5383 Other fatigue: Secondary | ICD-10-CM | POA: Diagnosis not present

## 2018-06-15 DIAGNOSIS — E119 Type 2 diabetes mellitus without complications: Secondary | ICD-10-CM | POA: Diagnosis not present

## 2018-06-15 DIAGNOSIS — I5189 Other ill-defined heart diseases: Secondary | ICD-10-CM | POA: Diagnosis not present

## 2018-06-15 DIAGNOSIS — I10 Essential (primary) hypertension: Secondary | ICD-10-CM | POA: Diagnosis not present

## 2018-06-25 DIAGNOSIS — L578 Other skin changes due to chronic exposure to nonionizing radiation: Secondary | ICD-10-CM | POA: Diagnosis not present

## 2018-06-25 DIAGNOSIS — D485 Neoplasm of uncertain behavior of skin: Secondary | ICD-10-CM | POA: Diagnosis not present

## 2018-06-25 DIAGNOSIS — B86 Scabies: Secondary | ICD-10-CM | POA: Diagnosis not present

## 2018-06-25 DIAGNOSIS — L821 Other seborrheic keratosis: Secondary | ICD-10-CM | POA: Diagnosis not present

## 2018-06-25 DIAGNOSIS — T7840XA Allergy, unspecified, initial encounter: Secondary | ICD-10-CM | POA: Diagnosis not present

## 2018-07-06 DIAGNOSIS — I110 Hypertensive heart disease with heart failure: Secondary | ICD-10-CM | POA: Diagnosis not present

## 2018-07-06 DIAGNOSIS — H409 Unspecified glaucoma: Secondary | ICD-10-CM | POA: Diagnosis present

## 2018-07-06 DIAGNOSIS — I493 Ventricular premature depolarization: Secondary | ICD-10-CM | POA: Diagnosis present

## 2018-07-06 DIAGNOSIS — F0391 Unspecified dementia with behavioral disturbance: Secondary | ICD-10-CM | POA: Diagnosis not present

## 2018-07-06 DIAGNOSIS — I083 Combined rheumatic disorders of mitral, aortic and tricuspid valves: Secondary | ICD-10-CM | POA: Diagnosis not present

## 2018-07-06 DIAGNOSIS — Z7984 Long term (current) use of oral hypoglycemic drugs: Secondary | ICD-10-CM | POA: Diagnosis not present

## 2018-07-06 DIAGNOSIS — Z9049 Acquired absence of other specified parts of digestive tract: Secondary | ICD-10-CM | POA: Diagnosis not present

## 2018-07-06 DIAGNOSIS — R0602 Shortness of breath: Secondary | ICD-10-CM | POA: Diagnosis not present

## 2018-07-06 DIAGNOSIS — Z885 Allergy status to narcotic agent status: Secondary | ICD-10-CM | POA: Diagnosis not present

## 2018-07-06 DIAGNOSIS — Z8673 Personal history of transient ischemic attack (TIA), and cerebral infarction without residual deficits: Secondary | ICD-10-CM | POA: Diagnosis not present

## 2018-07-06 DIAGNOSIS — I491 Atrial premature depolarization: Secondary | ICD-10-CM | POA: Diagnosis present

## 2018-07-06 DIAGNOSIS — I712 Thoracic aortic aneurysm, without rupture: Secondary | ICD-10-CM | POA: Diagnosis not present

## 2018-07-06 DIAGNOSIS — E119 Type 2 diabetes mellitus without complications: Secondary | ICD-10-CM | POA: Diagnosis not present

## 2018-07-06 DIAGNOSIS — Z7722 Contact with and (suspected) exposure to environmental tobacco smoke (acute) (chronic): Secondary | ICD-10-CM | POA: Diagnosis present

## 2018-07-06 DIAGNOSIS — Z87442 Personal history of urinary calculi: Secondary | ICD-10-CM | POA: Diagnosis not present

## 2018-07-06 DIAGNOSIS — I5023 Acute on chronic systolic (congestive) heart failure: Secondary | ICD-10-CM | POA: Diagnosis not present

## 2018-07-06 DIAGNOSIS — I21A1 Myocardial infarction type 2: Secondary | ICD-10-CM | POA: Diagnosis not present

## 2018-07-06 DIAGNOSIS — Z7409 Other reduced mobility: Secondary | ICD-10-CM | POA: Diagnosis not present

## 2018-07-06 DIAGNOSIS — I447 Left bundle-branch block, unspecified: Secondary | ICD-10-CM | POA: Diagnosis not present

## 2018-07-06 DIAGNOSIS — Z8249 Family history of ischemic heart disease and other diseases of the circulatory system: Secondary | ICD-10-CM | POA: Diagnosis not present

## 2018-07-06 DIAGNOSIS — R000 Tachycardia, unspecified: Secondary | ICD-10-CM | POA: Diagnosis not present

## 2018-07-06 DIAGNOSIS — Z79899 Other long term (current) drug therapy: Secondary | ICD-10-CM | POA: Diagnosis not present

## 2018-07-06 DIAGNOSIS — Z7982 Long term (current) use of aspirin: Secondary | ICD-10-CM | POA: Diagnosis not present

## 2018-07-06 DIAGNOSIS — I517 Cardiomegaly: Secondary | ICD-10-CM | POA: Diagnosis not present

## 2018-07-06 DIAGNOSIS — Z789 Other specified health status: Secondary | ICD-10-CM | POA: Diagnosis not present

## 2018-07-06 DIAGNOSIS — Z9181 History of falling: Secondary | ICD-10-CM | POA: Diagnosis not present

## 2018-07-06 DIAGNOSIS — N390 Urinary tract infection, site not specified: Secondary | ICD-10-CM | POA: Diagnosis present

## 2018-07-06 DIAGNOSIS — J309 Allergic rhinitis, unspecified: Secondary | ICD-10-CM | POA: Diagnosis present

## 2018-07-06 DIAGNOSIS — I2510 Atherosclerotic heart disease of native coronary artery without angina pectoris: Secondary | ICD-10-CM | POA: Diagnosis present

## 2018-07-06 DIAGNOSIS — R0600 Dyspnea, unspecified: Secondary | ICD-10-CM | POA: Diagnosis not present

## 2018-07-06 DIAGNOSIS — R7989 Other specified abnormal findings of blood chemistry: Secondary | ICD-10-CM | POA: Diagnosis not present

## 2018-07-06 DIAGNOSIS — I252 Old myocardial infarction: Secondary | ICD-10-CM | POA: Diagnosis not present

## 2018-07-06 DIAGNOSIS — Z888 Allergy status to other drugs, medicaments and biological substances status: Secondary | ICD-10-CM | POA: Diagnosis not present

## 2018-07-06 DIAGNOSIS — N400 Enlarged prostate without lower urinary tract symptoms: Secondary | ICD-10-CM | POA: Diagnosis present

## 2018-07-10 DIAGNOSIS — H401231 Low-tension glaucoma, bilateral, mild stage: Secondary | ICD-10-CM | POA: Diagnosis not present

## 2018-07-10 DIAGNOSIS — E1151 Type 2 diabetes mellitus with diabetic peripheral angiopathy without gangrene: Secondary | ICD-10-CM | POA: Diagnosis not present

## 2018-07-10 DIAGNOSIS — I5023 Acute on chronic systolic (congestive) heart failure: Secondary | ICD-10-CM | POA: Diagnosis not present

## 2018-07-10 DIAGNOSIS — N390 Urinary tract infection, site not specified: Secondary | ICD-10-CM | POA: Diagnosis not present

## 2018-07-10 DIAGNOSIS — Z9181 History of falling: Secondary | ICD-10-CM | POA: Diagnosis not present

## 2018-07-10 DIAGNOSIS — K219 Gastro-esophageal reflux disease without esophagitis: Secondary | ICD-10-CM | POA: Diagnosis not present

## 2018-07-10 DIAGNOSIS — I2510 Atherosclerotic heart disease of native coronary artery without angina pectoris: Secondary | ICD-10-CM | POA: Diagnosis not present

## 2018-07-10 DIAGNOSIS — I21A1 Myocardial infarction type 2: Secondary | ICD-10-CM | POA: Diagnosis not present

## 2018-07-10 DIAGNOSIS — K589 Irritable bowel syndrome without diarrhea: Secondary | ICD-10-CM | POA: Diagnosis not present

## 2018-07-10 DIAGNOSIS — Z7982 Long term (current) use of aspirin: Secondary | ICD-10-CM | POA: Diagnosis not present

## 2018-07-10 DIAGNOSIS — Z8673 Personal history of transient ischemic attack (TIA), and cerebral infarction without residual deficits: Secondary | ICD-10-CM | POA: Diagnosis not present

## 2018-07-10 DIAGNOSIS — H353131 Nonexudative age-related macular degeneration, bilateral, early dry stage: Secondary | ICD-10-CM | POA: Diagnosis not present

## 2018-07-10 DIAGNOSIS — I110 Hypertensive heart disease with heart failure: Secondary | ICD-10-CM | POA: Diagnosis not present

## 2018-07-10 DIAGNOSIS — F0390 Unspecified dementia without behavioral disturbance: Secondary | ICD-10-CM | POA: Diagnosis not present

## 2018-07-10 DIAGNOSIS — N400 Enlarged prostate without lower urinary tract symptoms: Secondary | ICD-10-CM | POA: Diagnosis not present

## 2018-07-10 DIAGNOSIS — Z7984 Long term (current) use of oral hypoglycemic drugs: Secondary | ICD-10-CM | POA: Diagnosis not present

## 2018-07-10 DIAGNOSIS — E785 Hyperlipidemia, unspecified: Secondary | ICD-10-CM | POA: Diagnosis not present

## 2018-07-11 DIAGNOSIS — F0390 Unspecified dementia without behavioral disturbance: Secondary | ICD-10-CM | POA: Diagnosis not present

## 2018-07-11 DIAGNOSIS — K219 Gastro-esophageal reflux disease without esophagitis: Secondary | ICD-10-CM | POA: Diagnosis not present

## 2018-07-11 DIAGNOSIS — E1151 Type 2 diabetes mellitus with diabetic peripheral angiopathy without gangrene: Secondary | ICD-10-CM | POA: Diagnosis not present

## 2018-07-11 DIAGNOSIS — I5023 Acute on chronic systolic (congestive) heart failure: Secondary | ICD-10-CM | POA: Diagnosis not present

## 2018-07-11 DIAGNOSIS — I110 Hypertensive heart disease with heart failure: Secondary | ICD-10-CM | POA: Diagnosis not present

## 2018-07-11 DIAGNOSIS — I21A1 Myocardial infarction type 2: Secondary | ICD-10-CM | POA: Diagnosis not present

## 2018-07-13 DIAGNOSIS — I5020 Unspecified systolic (congestive) heart failure: Secondary | ICD-10-CM | POA: Diagnosis not present

## 2018-07-13 DIAGNOSIS — I2510 Atherosclerotic heart disease of native coronary artery without angina pectoris: Secondary | ICD-10-CM | POA: Diagnosis not present

## 2018-07-13 DIAGNOSIS — E785 Hyperlipidemia, unspecified: Secondary | ICD-10-CM | POA: Diagnosis not present

## 2018-07-13 DIAGNOSIS — E1151 Type 2 diabetes mellitus with diabetic peripheral angiopathy without gangrene: Secondary | ICD-10-CM | POA: Diagnosis not present

## 2018-07-14 DIAGNOSIS — R9431 Abnormal electrocardiogram [ECG] [EKG]: Secondary | ICD-10-CM | POA: Diagnosis not present

## 2018-07-14 DIAGNOSIS — R5383 Other fatigue: Secondary | ICD-10-CM | POA: Diagnosis not present

## 2018-07-14 DIAGNOSIS — I5189 Other ill-defined heart diseases: Secondary | ICD-10-CM | POA: Diagnosis not present

## 2018-07-14 DIAGNOSIS — I429 Cardiomyopathy, unspecified: Secondary | ICD-10-CM | POA: Diagnosis not present

## 2018-07-14 DIAGNOSIS — R0609 Other forms of dyspnea: Secondary | ICD-10-CM | POA: Diagnosis not present

## 2018-07-14 DIAGNOSIS — E119 Type 2 diabetes mellitus without complications: Secondary | ICD-10-CM | POA: Diagnosis not present

## 2018-07-14 DIAGNOSIS — R931 Abnormal findings on diagnostic imaging of heart and coronary circulation: Secondary | ICD-10-CM | POA: Diagnosis not present

## 2018-07-14 DIAGNOSIS — I4949 Other premature depolarization: Secondary | ICD-10-CM | POA: Diagnosis not present

## 2018-07-14 DIAGNOSIS — I2510 Atherosclerotic heart disease of native coronary artery without angina pectoris: Secondary | ICD-10-CM | POA: Diagnosis not present

## 2018-07-14 DIAGNOSIS — I10 Essential (primary) hypertension: Secondary | ICD-10-CM | POA: Diagnosis not present

## 2018-07-14 DIAGNOSIS — R600 Localized edema: Secondary | ICD-10-CM | POA: Diagnosis not present

## 2018-07-15 DIAGNOSIS — E1151 Type 2 diabetes mellitus with diabetic peripheral angiopathy without gangrene: Secondary | ICD-10-CM | POA: Diagnosis not present

## 2018-07-15 DIAGNOSIS — K219 Gastro-esophageal reflux disease without esophagitis: Secondary | ICD-10-CM | POA: Diagnosis not present

## 2018-07-15 DIAGNOSIS — I5023 Acute on chronic systolic (congestive) heart failure: Secondary | ICD-10-CM | POA: Diagnosis not present

## 2018-07-15 DIAGNOSIS — I110 Hypertensive heart disease with heart failure: Secondary | ICD-10-CM | POA: Diagnosis not present

## 2018-07-15 DIAGNOSIS — I21A1 Myocardial infarction type 2: Secondary | ICD-10-CM | POA: Diagnosis not present

## 2018-07-15 DIAGNOSIS — F0390 Unspecified dementia without behavioral disturbance: Secondary | ICD-10-CM | POA: Diagnosis not present

## 2018-07-20 DIAGNOSIS — K219 Gastro-esophageal reflux disease without esophagitis: Secondary | ICD-10-CM | POA: Diagnosis not present

## 2018-07-20 DIAGNOSIS — I110 Hypertensive heart disease with heart failure: Secondary | ICD-10-CM | POA: Diagnosis not present

## 2018-07-20 DIAGNOSIS — E1151 Type 2 diabetes mellitus with diabetic peripheral angiopathy without gangrene: Secondary | ICD-10-CM | POA: Diagnosis not present

## 2018-07-20 DIAGNOSIS — I21A1 Myocardial infarction type 2: Secondary | ICD-10-CM | POA: Diagnosis not present

## 2018-07-20 DIAGNOSIS — L300 Nummular dermatitis: Secondary | ICD-10-CM | POA: Diagnosis not present

## 2018-07-20 DIAGNOSIS — I5023 Acute on chronic systolic (congestive) heart failure: Secondary | ICD-10-CM | POA: Diagnosis not present

## 2018-07-20 DIAGNOSIS — F0390 Unspecified dementia without behavioral disturbance: Secondary | ICD-10-CM | POA: Diagnosis not present

## 2018-07-22 DIAGNOSIS — F0390 Unspecified dementia without behavioral disturbance: Secondary | ICD-10-CM | POA: Diagnosis not present

## 2018-07-22 DIAGNOSIS — I5023 Acute on chronic systolic (congestive) heart failure: Secondary | ICD-10-CM | POA: Diagnosis not present

## 2018-07-22 DIAGNOSIS — K219 Gastro-esophageal reflux disease without esophagitis: Secondary | ICD-10-CM | POA: Diagnosis not present

## 2018-07-22 DIAGNOSIS — I21A1 Myocardial infarction type 2: Secondary | ICD-10-CM | POA: Diagnosis not present

## 2018-07-22 DIAGNOSIS — I110 Hypertensive heart disease with heart failure: Secondary | ICD-10-CM | POA: Diagnosis not present

## 2018-07-22 DIAGNOSIS — E1151 Type 2 diabetes mellitus with diabetic peripheral angiopathy without gangrene: Secondary | ICD-10-CM | POA: Diagnosis not present

## 2018-07-23 DIAGNOSIS — E1151 Type 2 diabetes mellitus with diabetic peripheral angiopathy without gangrene: Secondary | ICD-10-CM | POA: Diagnosis not present

## 2018-07-23 DIAGNOSIS — I21A1 Myocardial infarction type 2: Secondary | ICD-10-CM | POA: Diagnosis not present

## 2018-07-23 DIAGNOSIS — I5023 Acute on chronic systolic (congestive) heart failure: Secondary | ICD-10-CM | POA: Diagnosis not present

## 2018-07-23 DIAGNOSIS — K219 Gastro-esophageal reflux disease without esophagitis: Secondary | ICD-10-CM | POA: Diagnosis not present

## 2018-07-23 DIAGNOSIS — I110 Hypertensive heart disease with heart failure: Secondary | ICD-10-CM | POA: Diagnosis not present

## 2018-07-23 DIAGNOSIS — F0390 Unspecified dementia without behavioral disturbance: Secondary | ICD-10-CM | POA: Diagnosis not present

## 2018-07-27 DIAGNOSIS — E119 Type 2 diabetes mellitus without complications: Secondary | ICD-10-CM | POA: Diagnosis not present

## 2018-07-27 DIAGNOSIS — I10 Essential (primary) hypertension: Secondary | ICD-10-CM | POA: Diagnosis not present

## 2018-07-27 DIAGNOSIS — R9431 Abnormal electrocardiogram [ECG] [EKG]: Secondary | ICD-10-CM | POA: Diagnosis not present

## 2018-07-27 DIAGNOSIS — R931 Abnormal findings on diagnostic imaging of heart and coronary circulation: Secondary | ICD-10-CM | POA: Diagnosis not present

## 2018-07-27 DIAGNOSIS — R0609 Other forms of dyspnea: Secondary | ICD-10-CM | POA: Diagnosis not present

## 2018-07-27 DIAGNOSIS — I2510 Atherosclerotic heart disease of native coronary artery without angina pectoris: Secondary | ICD-10-CM | POA: Diagnosis not present

## 2018-07-27 DIAGNOSIS — R5383 Other fatigue: Secondary | ICD-10-CM | POA: Diagnosis not present

## 2018-07-27 DIAGNOSIS — I5189 Other ill-defined heart diseases: Secondary | ICD-10-CM | POA: Diagnosis not present

## 2018-07-28 DIAGNOSIS — R339 Retention of urine, unspecified: Secondary | ICD-10-CM | POA: Diagnosis not present

## 2018-07-28 DIAGNOSIS — N401 Enlarged prostate with lower urinary tract symptoms: Secondary | ICD-10-CM | POA: Diagnosis not present

## 2018-07-29 DIAGNOSIS — I110 Hypertensive heart disease with heart failure: Secondary | ICD-10-CM | POA: Diagnosis not present

## 2018-07-29 DIAGNOSIS — K219 Gastro-esophageal reflux disease without esophagitis: Secondary | ICD-10-CM | POA: Diagnosis not present

## 2018-07-29 DIAGNOSIS — I21A1 Myocardial infarction type 2: Secondary | ICD-10-CM | POA: Diagnosis not present

## 2018-07-29 DIAGNOSIS — E1151 Type 2 diabetes mellitus with diabetic peripheral angiopathy without gangrene: Secondary | ICD-10-CM | POA: Diagnosis not present

## 2018-07-29 DIAGNOSIS — I5023 Acute on chronic systolic (congestive) heart failure: Secondary | ICD-10-CM | POA: Diagnosis not present

## 2018-07-29 DIAGNOSIS — F0390 Unspecified dementia without behavioral disturbance: Secondary | ICD-10-CM | POA: Diagnosis not present

## 2018-07-30 DIAGNOSIS — I110 Hypertensive heart disease with heart failure: Secondary | ICD-10-CM | POA: Diagnosis not present

## 2018-07-30 DIAGNOSIS — I21A1 Myocardial infarction type 2: Secondary | ICD-10-CM | POA: Diagnosis not present

## 2018-07-30 DIAGNOSIS — I5023 Acute on chronic systolic (congestive) heart failure: Secondary | ICD-10-CM | POA: Diagnosis not present

## 2018-07-30 DIAGNOSIS — E1151 Type 2 diabetes mellitus with diabetic peripheral angiopathy without gangrene: Secondary | ICD-10-CM | POA: Diagnosis not present

## 2018-07-30 DIAGNOSIS — F0390 Unspecified dementia without behavioral disturbance: Secondary | ICD-10-CM | POA: Diagnosis not present

## 2018-07-30 DIAGNOSIS — K219 Gastro-esophageal reflux disease without esophagitis: Secondary | ICD-10-CM | POA: Diagnosis not present

## 2018-07-31 DIAGNOSIS — H401231 Low-tension glaucoma, bilateral, mild stage: Secondary | ICD-10-CM | POA: Diagnosis not present

## 2018-08-02 DIAGNOSIS — G319 Degenerative disease of nervous system, unspecified: Secondary | ICD-10-CM | POA: Diagnosis not present

## 2018-08-02 DIAGNOSIS — R000 Tachycardia, unspecified: Secondary | ICD-10-CM | POA: Diagnosis not present

## 2018-08-02 DIAGNOSIS — G9341 Metabolic encephalopathy: Secondary | ICD-10-CM | POA: Diagnosis not present

## 2018-08-02 DIAGNOSIS — G459 Transient cerebral ischemic attack, unspecified: Secondary | ICD-10-CM | POA: Diagnosis not present

## 2018-08-02 DIAGNOSIS — I5042 Chronic combined systolic (congestive) and diastolic (congestive) heart failure: Secondary | ICD-10-CM | POA: Diagnosis not present

## 2018-08-02 DIAGNOSIS — R9389 Abnormal findings on diagnostic imaging of other specified body structures: Secondary | ICD-10-CM | POA: Diagnosis not present

## 2018-08-02 DIAGNOSIS — N3001 Acute cystitis with hematuria: Secondary | ICD-10-CM | POA: Diagnosis not present

## 2018-08-02 DIAGNOSIS — R51 Headache: Secondary | ICD-10-CM | POA: Diagnosis not present

## 2018-08-02 DIAGNOSIS — I672 Cerebral atherosclerosis: Secondary | ICD-10-CM | POA: Diagnosis not present

## 2018-08-02 DIAGNOSIS — I110 Hypertensive heart disease with heart failure: Secondary | ICD-10-CM | POA: Diagnosis not present

## 2018-08-02 DIAGNOSIS — I2510 Atherosclerotic heart disease of native coronary artery without angina pectoris: Secondary | ICD-10-CM | POA: Diagnosis not present

## 2018-08-02 DIAGNOSIS — N390 Urinary tract infection, site not specified: Secondary | ICD-10-CM | POA: Diagnosis not present

## 2018-08-02 DIAGNOSIS — E1165 Type 2 diabetes mellitus with hyperglycemia: Secondary | ICD-10-CM | POA: Diagnosis not present

## 2018-08-02 DIAGNOSIS — R5383 Other fatigue: Secondary | ICD-10-CM | POA: Diagnosis not present

## 2018-08-02 DIAGNOSIS — R05 Cough: Secondary | ICD-10-CM | POA: Diagnosis not present

## 2018-08-03 DIAGNOSIS — R5383 Other fatigue: Secondary | ICD-10-CM | POA: Diagnosis not present

## 2018-08-03 DIAGNOSIS — G9341 Metabolic encephalopathy: Secondary | ICD-10-CM | POA: Diagnosis not present

## 2018-08-03 DIAGNOSIS — I672 Cerebral atherosclerosis: Secondary | ICD-10-CM | POA: Diagnosis not present

## 2018-08-03 DIAGNOSIS — R05 Cough: Secondary | ICD-10-CM | POA: Diagnosis not present

## 2018-08-03 DIAGNOSIS — G319 Degenerative disease of nervous system, unspecified: Secondary | ICD-10-CM | POA: Diagnosis not present

## 2018-08-03 DIAGNOSIS — Z03818 Encounter for observation for suspected exposure to other biological agents ruled out: Secondary | ICD-10-CM | POA: Diagnosis not present

## 2018-08-03 DIAGNOSIS — R51 Headache: Secondary | ICD-10-CM | POA: Diagnosis not present

## 2018-08-03 DIAGNOSIS — R399 Unspecified symptoms and signs involving the genitourinary system: Secondary | ICD-10-CM | POA: Diagnosis not present

## 2018-08-03 DIAGNOSIS — R9389 Abnormal findings on diagnostic imaging of other specified body structures: Secondary | ICD-10-CM | POA: Diagnosis not present

## 2018-08-04 DIAGNOSIS — R7989 Other specified abnormal findings of blood chemistry: Secondary | ICD-10-CM | POA: Diagnosis not present

## 2018-08-04 DIAGNOSIS — G459 Transient cerebral ischemic attack, unspecified: Secondary | ICD-10-CM | POA: Diagnosis not present

## 2018-08-04 DIAGNOSIS — I2510 Atherosclerotic heart disease of native coronary artery without angina pectoris: Secondary | ICD-10-CM | POA: Diagnosis present

## 2018-08-04 DIAGNOSIS — Z7984 Long term (current) use of oral hypoglycemic drugs: Secondary | ICD-10-CM | POA: Diagnosis not present

## 2018-08-04 DIAGNOSIS — I5042 Chronic combined systolic (congestive) and diastolic (congestive) heart failure: Secondary | ICD-10-CM | POA: Diagnosis present

## 2018-08-04 DIAGNOSIS — R000 Tachycardia, unspecified: Secondary | ICD-10-CM | POA: Diagnosis not present

## 2018-08-04 DIAGNOSIS — I6389 Other cerebral infarction: Secondary | ICD-10-CM | POA: Diagnosis not present

## 2018-08-04 DIAGNOSIS — B952 Enterococcus as the cause of diseases classified elsewhere: Secondary | ICD-10-CM | POA: Diagnosis present

## 2018-08-04 DIAGNOSIS — R9431 Abnormal electrocardiogram [ECG] [EKG]: Secondary | ICD-10-CM | POA: Diagnosis not present

## 2018-08-04 DIAGNOSIS — N3001 Acute cystitis with hematuria: Secondary | ICD-10-CM | POA: Diagnosis present

## 2018-08-04 DIAGNOSIS — I10 Essential (primary) hypertension: Secondary | ICD-10-CM | POA: Diagnosis not present

## 2018-08-04 DIAGNOSIS — I361 Nonrheumatic tricuspid (valve) insufficiency: Secondary | ICD-10-CM | POA: Diagnosis not present

## 2018-08-04 DIAGNOSIS — I340 Nonrheumatic mitral (valve) insufficiency: Secondary | ICD-10-CM | POA: Diagnosis not present

## 2018-08-04 DIAGNOSIS — Z20828 Contact with and (suspected) exposure to other viral communicable diseases: Secondary | ICD-10-CM | POA: Diagnosis present

## 2018-08-04 DIAGNOSIS — N401 Enlarged prostate with lower urinary tract symptoms: Secondary | ICD-10-CM | POA: Diagnosis present

## 2018-08-04 DIAGNOSIS — G9341 Metabolic encephalopathy: Secondary | ICD-10-CM | POA: Diagnosis present

## 2018-08-04 DIAGNOSIS — Z8673 Personal history of transient ischemic attack (TIA), and cerebral infarction without residual deficits: Secondary | ICD-10-CM | POA: Diagnosis not present

## 2018-08-04 DIAGNOSIS — R338 Other retention of urine: Secondary | ICD-10-CM | POA: Diagnosis present

## 2018-08-04 DIAGNOSIS — I110 Hypertensive heart disease with heart failure: Secondary | ICD-10-CM | POA: Diagnosis present

## 2018-08-04 DIAGNOSIS — E1165 Type 2 diabetes mellitus with hyperglycemia: Secondary | ICD-10-CM | POA: Diagnosis not present

## 2018-08-06 DIAGNOSIS — I5023 Acute on chronic systolic (congestive) heart failure: Secondary | ICD-10-CM | POA: Diagnosis not present

## 2018-08-06 DIAGNOSIS — K219 Gastro-esophageal reflux disease without esophagitis: Secondary | ICD-10-CM | POA: Diagnosis not present

## 2018-08-06 DIAGNOSIS — I21A1 Myocardial infarction type 2: Secondary | ICD-10-CM | POA: Diagnosis not present

## 2018-08-06 DIAGNOSIS — E1151 Type 2 diabetes mellitus with diabetic peripheral angiopathy without gangrene: Secondary | ICD-10-CM | POA: Diagnosis not present

## 2018-08-06 DIAGNOSIS — I110 Hypertensive heart disease with heart failure: Secondary | ICD-10-CM | POA: Diagnosis not present

## 2018-08-06 DIAGNOSIS — F0390 Unspecified dementia without behavioral disturbance: Secondary | ICD-10-CM | POA: Diagnosis not present

## 2018-08-08 DIAGNOSIS — I5023 Acute on chronic systolic (congestive) heart failure: Secondary | ICD-10-CM | POA: Diagnosis not present

## 2018-08-08 DIAGNOSIS — I110 Hypertensive heart disease with heart failure: Secondary | ICD-10-CM | POA: Diagnosis not present

## 2018-08-08 DIAGNOSIS — I21A1 Myocardial infarction type 2: Secondary | ICD-10-CM | POA: Diagnosis not present

## 2018-08-08 DIAGNOSIS — F0390 Unspecified dementia without behavioral disturbance: Secondary | ICD-10-CM | POA: Diagnosis not present

## 2018-08-08 DIAGNOSIS — K219 Gastro-esophageal reflux disease without esophagitis: Secondary | ICD-10-CM | POA: Diagnosis not present

## 2018-08-08 DIAGNOSIS — E1151 Type 2 diabetes mellitus with diabetic peripheral angiopathy without gangrene: Secondary | ICD-10-CM | POA: Diagnosis not present

## 2018-08-09 DIAGNOSIS — E785 Hyperlipidemia, unspecified: Secondary | ICD-10-CM | POA: Diagnosis not present

## 2018-08-09 DIAGNOSIS — R4789 Other speech disturbances: Secondary | ICD-10-CM | POA: Diagnosis not present

## 2018-08-09 DIAGNOSIS — I130 Hypertensive heart and chronic kidney disease with heart failure and stage 1 through stage 4 chronic kidney disease, or unspecified chronic kidney disease: Secondary | ICD-10-CM | POA: Diagnosis not present

## 2018-08-09 DIAGNOSIS — Z7982 Long term (current) use of aspirin: Secondary | ICD-10-CM | POA: Diagnosis not present

## 2018-08-09 DIAGNOSIS — H401231 Low-tension glaucoma, bilateral, mild stage: Secondary | ICD-10-CM | POA: Diagnosis not present

## 2018-08-09 DIAGNOSIS — F0390 Unspecified dementia without behavioral disturbance: Secondary | ICD-10-CM | POA: Diagnosis not present

## 2018-08-09 DIAGNOSIS — Z7984 Long term (current) use of oral hypoglycemic drugs: Secondary | ICD-10-CM | POA: Diagnosis not present

## 2018-08-09 DIAGNOSIS — N3001 Acute cystitis with hematuria: Secondary | ICD-10-CM | POA: Diagnosis not present

## 2018-08-09 DIAGNOSIS — R4182 Altered mental status, unspecified: Secondary | ICD-10-CM | POA: Diagnosis not present

## 2018-08-09 DIAGNOSIS — I2510 Atherosclerotic heart disease of native coronary artery without angina pectoris: Secondary | ICD-10-CM | POA: Diagnosis not present

## 2018-08-09 DIAGNOSIS — K219 Gastro-esophageal reflux disease without esophagitis: Secondary | ICD-10-CM | POA: Diagnosis not present

## 2018-08-09 DIAGNOSIS — I252 Old myocardial infarction: Secondary | ICD-10-CM | POA: Diagnosis not present

## 2018-08-09 DIAGNOSIS — Z8673 Personal history of transient ischemic attack (TIA), and cerebral infarction without residual deficits: Secondary | ICD-10-CM | POA: Diagnosis not present

## 2018-08-09 DIAGNOSIS — R400 Somnolence: Secondary | ICD-10-CM | POA: Diagnosis not present

## 2018-08-09 DIAGNOSIS — K589 Irritable bowel syndrome without diarrhea: Secondary | ICD-10-CM | POA: Diagnosis not present

## 2018-08-09 DIAGNOSIS — Z792 Long term (current) use of antibiotics: Secondary | ICD-10-CM | POA: Diagnosis not present

## 2018-08-09 DIAGNOSIS — Z79891 Long term (current) use of opiate analgesic: Secondary | ICD-10-CM | POA: Diagnosis not present

## 2018-08-09 DIAGNOSIS — H353131 Nonexudative age-related macular degeneration, bilateral, early dry stage: Secondary | ICD-10-CM | POA: Diagnosis not present

## 2018-08-09 DIAGNOSIS — E1151 Type 2 diabetes mellitus with diabetic peripheral angiopathy without gangrene: Secondary | ICD-10-CM | POA: Diagnosis not present

## 2018-08-09 DIAGNOSIS — N400 Enlarged prostate without lower urinary tract symptoms: Secondary | ICD-10-CM | POA: Diagnosis not present

## 2018-08-09 DIAGNOSIS — Z9181 History of falling: Secondary | ICD-10-CM | POA: Diagnosis not present

## 2018-08-09 DIAGNOSIS — I5043 Acute on chronic combined systolic (congestive) and diastolic (congestive) heart failure: Secondary | ICD-10-CM | POA: Diagnosis not present

## 2018-08-10 DIAGNOSIS — N138 Other obstructive and reflux uropathy: Secondary | ICD-10-CM | POA: Diagnosis not present

## 2018-08-10 DIAGNOSIS — N401 Enlarged prostate with lower urinary tract symptoms: Secondary | ICD-10-CM | POA: Diagnosis not present

## 2018-08-10 DIAGNOSIS — N390 Urinary tract infection, site not specified: Secondary | ICD-10-CM | POA: Diagnosis not present

## 2018-08-10 DIAGNOSIS — B952 Enterococcus as the cause of diseases classified elsewhere: Secondary | ICD-10-CM | POA: Diagnosis not present

## 2018-08-12 DIAGNOSIS — N3001 Acute cystitis with hematuria: Secondary | ICD-10-CM | POA: Diagnosis not present

## 2018-08-12 DIAGNOSIS — K219 Gastro-esophageal reflux disease without esophagitis: Secondary | ICD-10-CM | POA: Diagnosis not present

## 2018-08-12 DIAGNOSIS — I5043 Acute on chronic combined systolic (congestive) and diastolic (congestive) heart failure: Secondary | ICD-10-CM | POA: Diagnosis not present

## 2018-08-12 DIAGNOSIS — F0390 Unspecified dementia without behavioral disturbance: Secondary | ICD-10-CM | POA: Diagnosis not present

## 2018-08-12 DIAGNOSIS — I130 Hypertensive heart and chronic kidney disease with heart failure and stage 1 through stage 4 chronic kidney disease, or unspecified chronic kidney disease: Secondary | ICD-10-CM | POA: Diagnosis not present

## 2018-08-12 DIAGNOSIS — E1151 Type 2 diabetes mellitus with diabetic peripheral angiopathy without gangrene: Secondary | ICD-10-CM | POA: Diagnosis not present

## 2018-08-13 DIAGNOSIS — E1151 Type 2 diabetes mellitus with diabetic peripheral angiopathy without gangrene: Secondary | ICD-10-CM | POA: Diagnosis not present

## 2018-08-13 DIAGNOSIS — I130 Hypertensive heart and chronic kidney disease with heart failure and stage 1 through stage 4 chronic kidney disease, or unspecified chronic kidney disease: Secondary | ICD-10-CM | POA: Diagnosis not present

## 2018-08-13 DIAGNOSIS — I5043 Acute on chronic combined systolic (congestive) and diastolic (congestive) heart failure: Secondary | ICD-10-CM | POA: Diagnosis not present

## 2018-08-13 DIAGNOSIS — K219 Gastro-esophageal reflux disease without esophagitis: Secondary | ICD-10-CM | POA: Diagnosis not present

## 2018-08-13 DIAGNOSIS — N3001 Acute cystitis with hematuria: Secondary | ICD-10-CM | POA: Diagnosis not present

## 2018-08-13 DIAGNOSIS — F0390 Unspecified dementia without behavioral disturbance: Secondary | ICD-10-CM | POA: Diagnosis not present

## 2018-08-14 DIAGNOSIS — N400 Enlarged prostate without lower urinary tract symptoms: Secondary | ICD-10-CM | POA: Diagnosis not present

## 2018-08-14 DIAGNOSIS — E785 Hyperlipidemia, unspecified: Secondary | ICD-10-CM | POA: Diagnosis not present

## 2018-08-14 DIAGNOSIS — I10 Essential (primary) hypertension: Secondary | ICD-10-CM | POA: Diagnosis not present

## 2018-08-17 DIAGNOSIS — I10 Essential (primary) hypertension: Secondary | ICD-10-CM | POA: Diagnosis not present

## 2018-08-17 DIAGNOSIS — E119 Type 2 diabetes mellitus without complications: Secondary | ICD-10-CM | POA: Diagnosis not present

## 2018-08-17 DIAGNOSIS — I429 Cardiomyopathy, unspecified: Secondary | ICD-10-CM | POA: Diagnosis not present

## 2018-08-17 DIAGNOSIS — I5189 Other ill-defined heart diseases: Secondary | ICD-10-CM | POA: Diagnosis not present

## 2018-08-17 DIAGNOSIS — R5383 Other fatigue: Secondary | ICD-10-CM | POA: Diagnosis not present

## 2018-08-17 DIAGNOSIS — R600 Localized edema: Secondary | ICD-10-CM | POA: Diagnosis not present

## 2018-08-17 DIAGNOSIS — I2510 Atherosclerotic heart disease of native coronary artery without angina pectoris: Secondary | ICD-10-CM | POA: Diagnosis not present

## 2018-08-18 DIAGNOSIS — I130 Hypertensive heart and chronic kidney disease with heart failure and stage 1 through stage 4 chronic kidney disease, or unspecified chronic kidney disease: Secondary | ICD-10-CM | POA: Diagnosis not present

## 2018-08-18 DIAGNOSIS — I5043 Acute on chronic combined systolic (congestive) and diastolic (congestive) heart failure: Secondary | ICD-10-CM | POA: Diagnosis not present

## 2018-08-18 DIAGNOSIS — N3001 Acute cystitis with hematuria: Secondary | ICD-10-CM | POA: Diagnosis not present

## 2018-08-18 DIAGNOSIS — E1151 Type 2 diabetes mellitus with diabetic peripheral angiopathy without gangrene: Secondary | ICD-10-CM | POA: Diagnosis not present

## 2018-08-18 DIAGNOSIS — F0390 Unspecified dementia without behavioral disturbance: Secondary | ICD-10-CM | POA: Diagnosis not present

## 2018-08-18 DIAGNOSIS — K219 Gastro-esophageal reflux disease without esophagitis: Secondary | ICD-10-CM | POA: Diagnosis not present

## 2018-08-20 DIAGNOSIS — E1151 Type 2 diabetes mellitus with diabetic peripheral angiopathy without gangrene: Secondary | ICD-10-CM | POA: Diagnosis not present

## 2018-08-20 DIAGNOSIS — N3001 Acute cystitis with hematuria: Secondary | ICD-10-CM | POA: Diagnosis not present

## 2018-08-20 DIAGNOSIS — F0390 Unspecified dementia without behavioral disturbance: Secondary | ICD-10-CM | POA: Diagnosis not present

## 2018-08-20 DIAGNOSIS — I130 Hypertensive heart and chronic kidney disease with heart failure and stage 1 through stage 4 chronic kidney disease, or unspecified chronic kidney disease: Secondary | ICD-10-CM | POA: Diagnosis not present

## 2018-08-20 DIAGNOSIS — K219 Gastro-esophageal reflux disease without esophagitis: Secondary | ICD-10-CM | POA: Diagnosis not present

## 2018-08-20 DIAGNOSIS — I5043 Acute on chronic combined systolic (congestive) and diastolic (congestive) heart failure: Secondary | ICD-10-CM | POA: Diagnosis not present

## 2018-08-24 DIAGNOSIS — I5043 Acute on chronic combined systolic (congestive) and diastolic (congestive) heart failure: Secondary | ICD-10-CM | POA: Diagnosis not present

## 2018-08-24 DIAGNOSIS — F0390 Unspecified dementia without behavioral disturbance: Secondary | ICD-10-CM | POA: Diagnosis not present

## 2018-08-24 DIAGNOSIS — E1151 Type 2 diabetes mellitus with diabetic peripheral angiopathy without gangrene: Secondary | ICD-10-CM | POA: Diagnosis not present

## 2018-08-24 DIAGNOSIS — I130 Hypertensive heart and chronic kidney disease with heart failure and stage 1 through stage 4 chronic kidney disease, or unspecified chronic kidney disease: Secondary | ICD-10-CM | POA: Diagnosis not present

## 2018-08-24 DIAGNOSIS — K219 Gastro-esophageal reflux disease without esophagitis: Secondary | ICD-10-CM | POA: Diagnosis not present

## 2018-08-24 DIAGNOSIS — N3001 Acute cystitis with hematuria: Secondary | ICD-10-CM | POA: Diagnosis not present

## 2018-08-26 DIAGNOSIS — I5043 Acute on chronic combined systolic (congestive) and diastolic (congestive) heart failure: Secondary | ICD-10-CM | POA: Diagnosis not present

## 2018-08-26 DIAGNOSIS — I130 Hypertensive heart and chronic kidney disease with heart failure and stage 1 through stage 4 chronic kidney disease, or unspecified chronic kidney disease: Secondary | ICD-10-CM | POA: Diagnosis not present

## 2018-08-26 DIAGNOSIS — K219 Gastro-esophageal reflux disease without esophagitis: Secondary | ICD-10-CM | POA: Diagnosis not present

## 2018-08-26 DIAGNOSIS — E1151 Type 2 diabetes mellitus with diabetic peripheral angiopathy without gangrene: Secondary | ICD-10-CM | POA: Diagnosis not present

## 2018-08-26 DIAGNOSIS — N3001 Acute cystitis with hematuria: Secondary | ICD-10-CM | POA: Diagnosis not present

## 2018-08-26 DIAGNOSIS — F0390 Unspecified dementia without behavioral disturbance: Secondary | ICD-10-CM | POA: Diagnosis not present

## 2018-08-27 DIAGNOSIS — F0390 Unspecified dementia without behavioral disturbance: Secondary | ICD-10-CM | POA: Diagnosis not present

## 2018-08-27 DIAGNOSIS — E1151 Type 2 diabetes mellitus with diabetic peripheral angiopathy without gangrene: Secondary | ICD-10-CM | POA: Diagnosis not present

## 2018-08-27 DIAGNOSIS — K219 Gastro-esophageal reflux disease without esophagitis: Secondary | ICD-10-CM | POA: Diagnosis not present

## 2018-08-27 DIAGNOSIS — I5043 Acute on chronic combined systolic (congestive) and diastolic (congestive) heart failure: Secondary | ICD-10-CM | POA: Diagnosis not present

## 2018-08-27 DIAGNOSIS — I130 Hypertensive heart and chronic kidney disease with heart failure and stage 1 through stage 4 chronic kidney disease, or unspecified chronic kidney disease: Secondary | ICD-10-CM | POA: Diagnosis not present

## 2018-08-27 DIAGNOSIS — N3001 Acute cystitis with hematuria: Secondary | ICD-10-CM | POA: Diagnosis not present

## 2018-08-31 DIAGNOSIS — I10 Essential (primary) hypertension: Secondary | ICD-10-CM | POA: Diagnosis not present

## 2018-08-31 DIAGNOSIS — I5020 Unspecified systolic (congestive) heart failure: Secondary | ICD-10-CM | POA: Diagnosis not present

## 2018-08-31 DIAGNOSIS — I2510 Atherosclerotic heart disease of native coronary artery without angina pectoris: Secondary | ICD-10-CM | POA: Diagnosis not present

## 2018-08-31 DIAGNOSIS — I739 Peripheral vascular disease, unspecified: Secondary | ICD-10-CM | POA: Diagnosis not present

## 2018-09-01 DIAGNOSIS — F0390 Unspecified dementia without behavioral disturbance: Secondary | ICD-10-CM | POA: Diagnosis not present

## 2018-09-01 DIAGNOSIS — I130 Hypertensive heart and chronic kidney disease with heart failure and stage 1 through stage 4 chronic kidney disease, or unspecified chronic kidney disease: Secondary | ICD-10-CM | POA: Diagnosis not present

## 2018-09-01 DIAGNOSIS — E1151 Type 2 diabetes mellitus with diabetic peripheral angiopathy without gangrene: Secondary | ICD-10-CM | POA: Diagnosis not present

## 2018-09-01 DIAGNOSIS — K219 Gastro-esophageal reflux disease without esophagitis: Secondary | ICD-10-CM | POA: Diagnosis not present

## 2018-09-01 DIAGNOSIS — N3001 Acute cystitis with hematuria: Secondary | ICD-10-CM | POA: Diagnosis not present

## 2018-09-01 DIAGNOSIS — I5043 Acute on chronic combined systolic (congestive) and diastolic (congestive) heart failure: Secondary | ICD-10-CM | POA: Diagnosis not present

## 2018-09-02 DIAGNOSIS — I252 Old myocardial infarction: Secondary | ICD-10-CM | POA: Diagnosis not present

## 2018-09-02 DIAGNOSIS — J9601 Acute respiratory failure with hypoxia: Secondary | ICD-10-CM | POA: Diagnosis present

## 2018-09-02 DIAGNOSIS — Z03818 Encounter for observation for suspected exposure to other biological agents ruled out: Secondary | ICD-10-CM | POA: Diagnosis not present

## 2018-09-02 DIAGNOSIS — Z8673 Personal history of transient ischemic attack (TIA), and cerebral infarction without residual deficits: Secondary | ICD-10-CM | POA: Diagnosis not present

## 2018-09-02 DIAGNOSIS — R9389 Abnormal findings on diagnostic imaging of other specified body structures: Secondary | ICD-10-CM | POA: Diagnosis not present

## 2018-09-02 DIAGNOSIS — J811 Chronic pulmonary edema: Secondary | ICD-10-CM | POA: Diagnosis not present

## 2018-09-02 DIAGNOSIS — I712 Thoracic aortic aneurysm, without rupture: Secondary | ICD-10-CM | POA: Diagnosis not present

## 2018-09-02 DIAGNOSIS — N183 Chronic kidney disease, stage 3 (moderate): Secondary | ICD-10-CM | POA: Diagnosis not present

## 2018-09-02 DIAGNOSIS — Z20828 Contact with and (suspected) exposure to other viral communicable diseases: Secondary | ICD-10-CM | POA: Diagnosis present

## 2018-09-02 DIAGNOSIS — K219 Gastro-esophageal reflux disease without esophagitis: Secondary | ICD-10-CM | POA: Diagnosis not present

## 2018-09-02 DIAGNOSIS — N3001 Acute cystitis with hematuria: Secondary | ICD-10-CM | POA: Diagnosis not present

## 2018-09-02 DIAGNOSIS — E1151 Type 2 diabetes mellitus with diabetic peripheral angiopathy without gangrene: Secondary | ICD-10-CM | POA: Diagnosis not present

## 2018-09-02 DIAGNOSIS — Z7984 Long term (current) use of oral hypoglycemic drugs: Secondary | ICD-10-CM | POA: Diagnosis not present

## 2018-09-02 DIAGNOSIS — I5043 Acute on chronic combined systolic (congestive) and diastolic (congestive) heart failure: Secondary | ICD-10-CM | POA: Diagnosis present

## 2018-09-02 DIAGNOSIS — Z7982 Long term (current) use of aspirin: Secondary | ICD-10-CM | POA: Diagnosis not present

## 2018-09-02 DIAGNOSIS — E1122 Type 2 diabetes mellitus with diabetic chronic kidney disease: Secondary | ICD-10-CM | POA: Diagnosis not present

## 2018-09-02 DIAGNOSIS — I130 Hypertensive heart and chronic kidney disease with heart failure and stage 1 through stage 4 chronic kidney disease, or unspecified chronic kidney disease: Secondary | ICD-10-CM | POA: Diagnosis present

## 2018-09-02 DIAGNOSIS — J9811 Atelectasis: Secondary | ICD-10-CM | POA: Diagnosis not present

## 2018-09-02 DIAGNOSIS — I2510 Atherosclerotic heart disease of native coronary artery without angina pectoris: Secondary | ICD-10-CM | POA: Diagnosis present

## 2018-09-02 DIAGNOSIS — N400 Enlarged prostate without lower urinary tract symptoms: Secondary | ICD-10-CM | POA: Diagnosis present

## 2018-09-02 DIAGNOSIS — R0602 Shortness of breath: Secondary | ICD-10-CM | POA: Diagnosis not present

## 2018-09-02 DIAGNOSIS — Z66 Do not resuscitate: Secondary | ICD-10-CM | POA: Diagnosis present

## 2018-09-02 DIAGNOSIS — F0390 Unspecified dementia without behavioral disturbance: Secondary | ICD-10-CM | POA: Diagnosis not present

## 2018-09-02 DIAGNOSIS — J189 Pneumonia, unspecified organism: Secondary | ICD-10-CM | POA: Diagnosis not present

## 2018-09-02 DIAGNOSIS — E1165 Type 2 diabetes mellitus with hyperglycemia: Secondary | ICD-10-CM | POA: Diagnosis present

## 2018-09-02 DIAGNOSIS — D631 Anemia in chronic kidney disease: Secondary | ICD-10-CM | POA: Diagnosis present

## 2018-09-07 DIAGNOSIS — F0390 Unspecified dementia without behavioral disturbance: Secondary | ICD-10-CM | POA: Diagnosis not present

## 2018-09-07 DIAGNOSIS — I5043 Acute on chronic combined systolic (congestive) and diastolic (congestive) heart failure: Secondary | ICD-10-CM | POA: Diagnosis not present

## 2018-09-07 DIAGNOSIS — I130 Hypertensive heart and chronic kidney disease with heart failure and stage 1 through stage 4 chronic kidney disease, or unspecified chronic kidney disease: Secondary | ICD-10-CM | POA: Diagnosis not present

## 2018-09-07 DIAGNOSIS — N3001 Acute cystitis with hematuria: Secondary | ICD-10-CM | POA: Diagnosis not present

## 2018-09-07 DIAGNOSIS — E1151 Type 2 diabetes mellitus with diabetic peripheral angiopathy without gangrene: Secondary | ICD-10-CM | POA: Diagnosis not present

## 2018-09-07 DIAGNOSIS — K219 Gastro-esophageal reflux disease without esophagitis: Secondary | ICD-10-CM | POA: Diagnosis not present

## 2018-09-08 DIAGNOSIS — E1122 Type 2 diabetes mellitus with diabetic chronic kidney disease: Secondary | ICD-10-CM | POA: Diagnosis not present

## 2018-09-08 DIAGNOSIS — E1165 Type 2 diabetes mellitus with hyperglycemia: Secondary | ICD-10-CM | POA: Diagnosis not present

## 2018-09-08 DIAGNOSIS — K219 Gastro-esophageal reflux disease without esophagitis: Secondary | ICD-10-CM | POA: Diagnosis not present

## 2018-09-08 DIAGNOSIS — E1151 Type 2 diabetes mellitus with diabetic peripheral angiopathy without gangrene: Secondary | ICD-10-CM | POA: Diagnosis not present

## 2018-09-08 DIAGNOSIS — K589 Irritable bowel syndrome without diarrhea: Secondary | ICD-10-CM | POA: Diagnosis not present

## 2018-09-08 DIAGNOSIS — I2510 Atherosclerotic heart disease of native coronary artery without angina pectoris: Secondary | ICD-10-CM | POA: Diagnosis not present

## 2018-09-08 DIAGNOSIS — H353131 Nonexudative age-related macular degeneration, bilateral, early dry stage: Secondary | ICD-10-CM | POA: Diagnosis not present

## 2018-09-08 DIAGNOSIS — E785 Hyperlipidemia, unspecified: Secondary | ICD-10-CM | POA: Diagnosis not present

## 2018-09-08 DIAGNOSIS — I252 Old myocardial infarction: Secondary | ICD-10-CM | POA: Diagnosis not present

## 2018-09-08 DIAGNOSIS — Z79891 Long term (current) use of opiate analgesic: Secondary | ICD-10-CM | POA: Diagnosis not present

## 2018-09-08 DIAGNOSIS — F0390 Unspecified dementia without behavioral disturbance: Secondary | ICD-10-CM | POA: Diagnosis not present

## 2018-09-08 DIAGNOSIS — J189 Pneumonia, unspecified organism: Secondary | ICD-10-CM | POA: Diagnosis not present

## 2018-09-08 DIAGNOSIS — I130 Hypertensive heart and chronic kidney disease with heart failure and stage 1 through stage 4 chronic kidney disease, or unspecified chronic kidney disease: Secondary | ICD-10-CM | POA: Diagnosis not present

## 2018-09-08 DIAGNOSIS — D631 Anemia in chronic kidney disease: Secondary | ICD-10-CM | POA: Diagnosis not present

## 2018-09-08 DIAGNOSIS — H401231 Low-tension glaucoma, bilateral, mild stage: Secondary | ICD-10-CM | POA: Diagnosis not present

## 2018-09-08 DIAGNOSIS — Z792 Long term (current) use of antibiotics: Secondary | ICD-10-CM | POA: Diagnosis not present

## 2018-09-08 DIAGNOSIS — Z7982 Long term (current) use of aspirin: Secondary | ICD-10-CM | POA: Diagnosis not present

## 2018-09-08 DIAGNOSIS — I5043 Acute on chronic combined systolic (congestive) and diastolic (congestive) heart failure: Secondary | ICD-10-CM | POA: Diagnosis not present

## 2018-09-08 DIAGNOSIS — Z7984 Long term (current) use of oral hypoglycemic drugs: Secondary | ICD-10-CM | POA: Diagnosis not present

## 2018-09-08 DIAGNOSIS — Z79899 Other long term (current) drug therapy: Secondary | ICD-10-CM | POA: Diagnosis not present

## 2018-09-08 DIAGNOSIS — R4789 Other speech disturbances: Secondary | ICD-10-CM | POA: Diagnosis not present

## 2018-09-08 DIAGNOSIS — N3001 Acute cystitis with hematuria: Secondary | ICD-10-CM | POA: Diagnosis not present

## 2018-09-08 DIAGNOSIS — N400 Enlarged prostate without lower urinary tract symptoms: Secondary | ICD-10-CM | POA: Diagnosis not present

## 2018-09-08 DIAGNOSIS — N183 Chronic kidney disease, stage 3 (moderate): Secondary | ICD-10-CM | POA: Diagnosis not present

## 2018-09-08 DIAGNOSIS — Z8673 Personal history of transient ischemic attack (TIA), and cerebral infarction without residual deficits: Secondary | ICD-10-CM | POA: Diagnosis not present

## 2018-09-09 DIAGNOSIS — I5043 Acute on chronic combined systolic (congestive) and diastolic (congestive) heart failure: Secondary | ICD-10-CM | POA: Diagnosis not present

## 2018-09-09 DIAGNOSIS — E1122 Type 2 diabetes mellitus with diabetic chronic kidney disease: Secondary | ICD-10-CM | POA: Diagnosis not present

## 2018-09-09 DIAGNOSIS — J189 Pneumonia, unspecified organism: Secondary | ICD-10-CM | POA: Diagnosis not present

## 2018-09-09 DIAGNOSIS — F0390 Unspecified dementia without behavioral disturbance: Secondary | ICD-10-CM | POA: Diagnosis not present

## 2018-09-09 DIAGNOSIS — N183 Chronic kidney disease, stage 3 (moderate): Secondary | ICD-10-CM | POA: Diagnosis not present

## 2018-09-09 DIAGNOSIS — I130 Hypertensive heart and chronic kidney disease with heart failure and stage 1 through stage 4 chronic kidney disease, or unspecified chronic kidney disease: Secondary | ICD-10-CM | POA: Diagnosis not present

## 2018-09-11 DIAGNOSIS — J189 Pneumonia, unspecified organism: Secondary | ICD-10-CM | POA: Diagnosis not present

## 2018-09-11 DIAGNOSIS — I5043 Acute on chronic combined systolic (congestive) and diastolic (congestive) heart failure: Secondary | ICD-10-CM | POA: Diagnosis not present

## 2018-09-11 DIAGNOSIS — E1122 Type 2 diabetes mellitus with diabetic chronic kidney disease: Secondary | ICD-10-CM | POA: Diagnosis not present

## 2018-09-11 DIAGNOSIS — N183 Chronic kidney disease, stage 3 (moderate): Secondary | ICD-10-CM | POA: Diagnosis not present

## 2018-09-11 DIAGNOSIS — I130 Hypertensive heart and chronic kidney disease with heart failure and stage 1 through stage 4 chronic kidney disease, or unspecified chronic kidney disease: Secondary | ICD-10-CM | POA: Diagnosis not present

## 2018-09-11 DIAGNOSIS — F0390 Unspecified dementia without behavioral disturbance: Secondary | ICD-10-CM | POA: Diagnosis not present

## 2018-09-15 DIAGNOSIS — I10 Essential (primary) hypertension: Secondary | ICD-10-CM | POA: Diagnosis not present

## 2018-09-15 DIAGNOSIS — E119 Type 2 diabetes mellitus without complications: Secondary | ICD-10-CM | POA: Diagnosis not present

## 2018-09-15 DIAGNOSIS — R600 Localized edema: Secondary | ICD-10-CM | POA: Diagnosis not present

## 2018-09-15 DIAGNOSIS — R5383 Other fatigue: Secondary | ICD-10-CM | POA: Diagnosis not present

## 2018-09-15 DIAGNOSIS — I5189 Other ill-defined heart diseases: Secondary | ICD-10-CM | POA: Diagnosis not present

## 2018-09-15 DIAGNOSIS — I429 Cardiomyopathy, unspecified: Secondary | ICD-10-CM | POA: Diagnosis not present

## 2018-09-15 DIAGNOSIS — I2510 Atherosclerotic heart disease of native coronary artery without angina pectoris: Secondary | ICD-10-CM | POA: Diagnosis not present

## 2018-09-17 DIAGNOSIS — E1122 Type 2 diabetes mellitus with diabetic chronic kidney disease: Secondary | ICD-10-CM | POA: Diagnosis not present

## 2018-09-17 DIAGNOSIS — I5043 Acute on chronic combined systolic (congestive) and diastolic (congestive) heart failure: Secondary | ICD-10-CM | POA: Diagnosis not present

## 2018-09-17 DIAGNOSIS — F0390 Unspecified dementia without behavioral disturbance: Secondary | ICD-10-CM | POA: Diagnosis not present

## 2018-09-17 DIAGNOSIS — N183 Chronic kidney disease, stage 3 (moderate): Secondary | ICD-10-CM | POA: Diagnosis not present

## 2018-09-17 DIAGNOSIS — I130 Hypertensive heart and chronic kidney disease with heart failure and stage 1 through stage 4 chronic kidney disease, or unspecified chronic kidney disease: Secondary | ICD-10-CM | POA: Diagnosis not present

## 2018-09-17 DIAGNOSIS — J189 Pneumonia, unspecified organism: Secondary | ICD-10-CM | POA: Diagnosis not present

## 2018-09-18 DIAGNOSIS — E1151 Type 2 diabetes mellitus with diabetic peripheral angiopathy without gangrene: Secondary | ICD-10-CM | POA: Diagnosis not present

## 2018-09-18 DIAGNOSIS — I5020 Unspecified systolic (congestive) heart failure: Secondary | ICD-10-CM | POA: Diagnosis not present

## 2018-09-18 DIAGNOSIS — R0902 Hypoxemia: Secondary | ICD-10-CM | POA: Diagnosis not present

## 2018-09-18 DIAGNOSIS — I10 Essential (primary) hypertension: Secondary | ICD-10-CM | POA: Diagnosis not present

## 2018-09-23 DIAGNOSIS — F0390 Unspecified dementia without behavioral disturbance: Secondary | ICD-10-CM | POA: Diagnosis not present

## 2018-09-23 DIAGNOSIS — I5043 Acute on chronic combined systolic (congestive) and diastolic (congestive) heart failure: Secondary | ICD-10-CM | POA: Diagnosis not present

## 2018-09-23 DIAGNOSIS — J189 Pneumonia, unspecified organism: Secondary | ICD-10-CM | POA: Diagnosis not present

## 2018-09-23 DIAGNOSIS — I130 Hypertensive heart and chronic kidney disease with heart failure and stage 1 through stage 4 chronic kidney disease, or unspecified chronic kidney disease: Secondary | ICD-10-CM | POA: Diagnosis not present

## 2018-09-23 DIAGNOSIS — E1122 Type 2 diabetes mellitus with diabetic chronic kidney disease: Secondary | ICD-10-CM | POA: Diagnosis not present

## 2018-09-23 DIAGNOSIS — N183 Chronic kidney disease, stage 3 (moderate): Secondary | ICD-10-CM | POA: Diagnosis not present

## 2018-09-30 DIAGNOSIS — I130 Hypertensive heart and chronic kidney disease with heart failure and stage 1 through stage 4 chronic kidney disease, or unspecified chronic kidney disease: Secondary | ICD-10-CM | POA: Diagnosis not present

## 2018-09-30 DIAGNOSIS — F0390 Unspecified dementia without behavioral disturbance: Secondary | ICD-10-CM | POA: Diagnosis not present

## 2018-09-30 DIAGNOSIS — N183 Chronic kidney disease, stage 3 (moderate): Secondary | ICD-10-CM | POA: Diagnosis not present

## 2018-09-30 DIAGNOSIS — I5043 Acute on chronic combined systolic (congestive) and diastolic (congestive) heart failure: Secondary | ICD-10-CM | POA: Diagnosis not present

## 2018-09-30 DIAGNOSIS — J189 Pneumonia, unspecified organism: Secondary | ICD-10-CM | POA: Diagnosis not present

## 2018-09-30 DIAGNOSIS — E1122 Type 2 diabetes mellitus with diabetic chronic kidney disease: Secondary | ICD-10-CM | POA: Diagnosis not present

## 2018-10-08 DIAGNOSIS — D631 Anemia in chronic kidney disease: Secondary | ICD-10-CM | POA: Diagnosis not present

## 2018-10-08 DIAGNOSIS — I2510 Atherosclerotic heart disease of native coronary artery without angina pectoris: Secondary | ICD-10-CM | POA: Diagnosis not present

## 2018-10-08 DIAGNOSIS — Z8673 Personal history of transient ischemic attack (TIA), and cerebral infarction without residual deficits: Secondary | ICD-10-CM | POA: Diagnosis not present

## 2018-10-08 DIAGNOSIS — I5043 Acute on chronic combined systolic (congestive) and diastolic (congestive) heart failure: Secondary | ICD-10-CM | POA: Diagnosis not present

## 2018-10-08 DIAGNOSIS — H401231 Low-tension glaucoma, bilateral, mild stage: Secondary | ICD-10-CM | POA: Diagnosis not present

## 2018-10-08 DIAGNOSIS — N400 Enlarged prostate without lower urinary tract symptoms: Secondary | ICD-10-CM | POA: Diagnosis not present

## 2018-10-08 DIAGNOSIS — Z7982 Long term (current) use of aspirin: Secondary | ICD-10-CM | POA: Diagnosis not present

## 2018-10-08 DIAGNOSIS — I252 Old myocardial infarction: Secondary | ICD-10-CM | POA: Diagnosis not present

## 2018-10-08 DIAGNOSIS — J189 Pneumonia, unspecified organism: Secondary | ICD-10-CM | POA: Diagnosis not present

## 2018-10-08 DIAGNOSIS — F0390 Unspecified dementia without behavioral disturbance: Secondary | ICD-10-CM | POA: Diagnosis not present

## 2018-10-08 DIAGNOSIS — N3001 Acute cystitis with hematuria: Secondary | ICD-10-CM | POA: Diagnosis not present

## 2018-10-08 DIAGNOSIS — Z792 Long term (current) use of antibiotics: Secondary | ICD-10-CM | POA: Diagnosis not present

## 2018-10-08 DIAGNOSIS — E1122 Type 2 diabetes mellitus with diabetic chronic kidney disease: Secondary | ICD-10-CM | POA: Diagnosis not present

## 2018-10-08 DIAGNOSIS — Z79899 Other long term (current) drug therapy: Secondary | ICD-10-CM | POA: Diagnosis not present

## 2018-10-08 DIAGNOSIS — Z7984 Long term (current) use of oral hypoglycemic drugs: Secondary | ICD-10-CM | POA: Diagnosis not present

## 2018-10-08 DIAGNOSIS — E1165 Type 2 diabetes mellitus with hyperglycemia: Secondary | ICD-10-CM | POA: Diagnosis not present

## 2018-10-08 DIAGNOSIS — R4789 Other speech disturbances: Secondary | ICD-10-CM | POA: Diagnosis not present

## 2018-10-08 DIAGNOSIS — E1151 Type 2 diabetes mellitus with diabetic peripheral angiopathy without gangrene: Secondary | ICD-10-CM | POA: Diagnosis not present

## 2018-10-08 DIAGNOSIS — K589 Irritable bowel syndrome without diarrhea: Secondary | ICD-10-CM | POA: Diagnosis not present

## 2018-10-08 DIAGNOSIS — K219 Gastro-esophageal reflux disease without esophagitis: Secondary | ICD-10-CM | POA: Diagnosis not present

## 2018-10-08 DIAGNOSIS — N183 Chronic kidney disease, stage 3 (moderate): Secondary | ICD-10-CM | POA: Diagnosis not present

## 2018-10-08 DIAGNOSIS — I130 Hypertensive heart and chronic kidney disease with heart failure and stage 1 through stage 4 chronic kidney disease, or unspecified chronic kidney disease: Secondary | ICD-10-CM | POA: Diagnosis not present

## 2018-10-08 DIAGNOSIS — Z79891 Long term (current) use of opiate analgesic: Secondary | ICD-10-CM | POA: Diagnosis not present

## 2018-10-08 DIAGNOSIS — E785 Hyperlipidemia, unspecified: Secondary | ICD-10-CM | POA: Diagnosis not present

## 2018-10-08 DIAGNOSIS — H353131 Nonexudative age-related macular degeneration, bilateral, early dry stage: Secondary | ICD-10-CM | POA: Diagnosis not present

## 2018-10-12 DIAGNOSIS — E119 Type 2 diabetes mellitus without complications: Secondary | ICD-10-CM | POA: Diagnosis not present

## 2018-10-12 DIAGNOSIS — I2510 Atherosclerotic heart disease of native coronary artery without angina pectoris: Secondary | ICD-10-CM | POA: Diagnosis not present

## 2018-10-12 DIAGNOSIS — I429 Cardiomyopathy, unspecified: Secondary | ICD-10-CM | POA: Diagnosis not present

## 2018-10-12 DIAGNOSIS — R600 Localized edema: Secondary | ICD-10-CM | POA: Diagnosis not present

## 2018-10-12 DIAGNOSIS — R5383 Other fatigue: Secondary | ICD-10-CM | POA: Diagnosis not present

## 2018-10-12 DIAGNOSIS — I10 Essential (primary) hypertension: Secondary | ICD-10-CM | POA: Diagnosis not present

## 2018-10-15 DIAGNOSIS — I10 Essential (primary) hypertension: Secondary | ICD-10-CM | POA: Diagnosis not present

## 2018-10-15 DIAGNOSIS — E1122 Type 2 diabetes mellitus with diabetic chronic kidney disease: Secondary | ICD-10-CM | POA: Diagnosis not present

## 2018-10-15 DIAGNOSIS — N183 Chronic kidney disease, stage 3 (moderate): Secondary | ICD-10-CM | POA: Diagnosis not present

## 2018-10-15 DIAGNOSIS — E1151 Type 2 diabetes mellitus with diabetic peripheral angiopathy without gangrene: Secondary | ICD-10-CM | POA: Diagnosis not present

## 2018-10-15 DIAGNOSIS — Z79899 Other long term (current) drug therapy: Secondary | ICD-10-CM | POA: Diagnosis not present

## 2018-10-15 DIAGNOSIS — J189 Pneumonia, unspecified organism: Secondary | ICD-10-CM | POA: Diagnosis not present

## 2018-10-15 DIAGNOSIS — Z0000 Encounter for general adult medical examination without abnormal findings: Secondary | ICD-10-CM | POA: Diagnosis not present

## 2018-10-15 DIAGNOSIS — I2510 Atherosclerotic heart disease of native coronary artery without angina pectoris: Secondary | ICD-10-CM | POA: Diagnosis not present

## 2018-10-15 DIAGNOSIS — I5020 Unspecified systolic (congestive) heart failure: Secondary | ICD-10-CM | POA: Diagnosis not present

## 2018-10-15 DIAGNOSIS — E785 Hyperlipidemia, unspecified: Secondary | ICD-10-CM | POA: Diagnosis not present

## 2018-10-15 DIAGNOSIS — I130 Hypertensive heart and chronic kidney disease with heart failure and stage 1 through stage 4 chronic kidney disease, or unspecified chronic kidney disease: Secondary | ICD-10-CM | POA: Diagnosis not present

## 2018-10-15 DIAGNOSIS — I5043 Acute on chronic combined systolic (congestive) and diastolic (congestive) heart failure: Secondary | ICD-10-CM | POA: Diagnosis not present

## 2018-10-15 DIAGNOSIS — F0390 Unspecified dementia without behavioral disturbance: Secondary | ICD-10-CM | POA: Diagnosis not present

## 2018-10-22 DIAGNOSIS — I130 Hypertensive heart and chronic kidney disease with heart failure and stage 1 through stage 4 chronic kidney disease, or unspecified chronic kidney disease: Secondary | ICD-10-CM | POA: Diagnosis not present

## 2018-10-22 DIAGNOSIS — N390 Urinary tract infection, site not specified: Secondary | ICD-10-CM | POA: Diagnosis not present

## 2018-10-22 DIAGNOSIS — F0390 Unspecified dementia without behavioral disturbance: Secondary | ICD-10-CM | POA: Diagnosis not present

## 2018-10-22 DIAGNOSIS — J189 Pneumonia, unspecified organism: Secondary | ICD-10-CM | POA: Diagnosis not present

## 2018-10-22 DIAGNOSIS — E1122 Type 2 diabetes mellitus with diabetic chronic kidney disease: Secondary | ICD-10-CM | POA: Diagnosis not present

## 2018-10-22 DIAGNOSIS — I5043 Acute on chronic combined systolic (congestive) and diastolic (congestive) heart failure: Secondary | ICD-10-CM | POA: Diagnosis not present

## 2018-10-22 DIAGNOSIS — N183 Chronic kidney disease, stage 3 (moderate): Secondary | ICD-10-CM | POA: Diagnosis not present

## 2018-10-28 DIAGNOSIS — E1122 Type 2 diabetes mellitus with diabetic chronic kidney disease: Secondary | ICD-10-CM | POA: Diagnosis not present

## 2018-10-28 DIAGNOSIS — F0390 Unspecified dementia without behavioral disturbance: Secondary | ICD-10-CM | POA: Diagnosis not present

## 2018-10-28 DIAGNOSIS — N183 Chronic kidney disease, stage 3 (moderate): Secondary | ICD-10-CM | POA: Diagnosis not present

## 2018-10-28 DIAGNOSIS — I5043 Acute on chronic combined systolic (congestive) and diastolic (congestive) heart failure: Secondary | ICD-10-CM | POA: Diagnosis not present

## 2018-10-28 DIAGNOSIS — J189 Pneumonia, unspecified organism: Secondary | ICD-10-CM | POA: Diagnosis not present

## 2018-10-28 DIAGNOSIS — I130 Hypertensive heart and chronic kidney disease with heart failure and stage 1 through stage 4 chronic kidney disease, or unspecified chronic kidney disease: Secondary | ICD-10-CM | POA: Diagnosis not present

## 2018-10-29 DIAGNOSIS — N401 Enlarged prostate with lower urinary tract symptoms: Secondary | ICD-10-CM | POA: Diagnosis not present

## 2018-10-29 DIAGNOSIS — R339 Retention of urine, unspecified: Secondary | ICD-10-CM | POA: Diagnosis not present

## 2018-11-06 DIAGNOSIS — F0390 Unspecified dementia without behavioral disturbance: Secondary | ICD-10-CM | POA: Diagnosis not present

## 2018-11-06 DIAGNOSIS — J189 Pneumonia, unspecified organism: Secondary | ICD-10-CM | POA: Diagnosis not present

## 2018-11-06 DIAGNOSIS — E1122 Type 2 diabetes mellitus with diabetic chronic kidney disease: Secondary | ICD-10-CM | POA: Diagnosis not present

## 2018-11-06 DIAGNOSIS — N183 Chronic kidney disease, stage 3 (moderate): Secondary | ICD-10-CM | POA: Diagnosis not present

## 2018-11-06 DIAGNOSIS — I130 Hypertensive heart and chronic kidney disease with heart failure and stage 1 through stage 4 chronic kidney disease, or unspecified chronic kidney disease: Secondary | ICD-10-CM | POA: Diagnosis not present

## 2018-11-06 DIAGNOSIS — I5043 Acute on chronic combined systolic (congestive) and diastolic (congestive) heart failure: Secondary | ICD-10-CM | POA: Diagnosis not present

## 2018-11-07 DIAGNOSIS — I252 Old myocardial infarction: Secondary | ICD-10-CM | POA: Diagnosis not present

## 2018-11-07 DIAGNOSIS — E1151 Type 2 diabetes mellitus with diabetic peripheral angiopathy without gangrene: Secondary | ICD-10-CM | POA: Diagnosis not present

## 2018-11-07 DIAGNOSIS — K589 Irritable bowel syndrome without diarrhea: Secondary | ICD-10-CM | POA: Diagnosis not present

## 2018-11-07 DIAGNOSIS — E1165 Type 2 diabetes mellitus with hyperglycemia: Secondary | ICD-10-CM | POA: Diagnosis not present

## 2018-11-07 DIAGNOSIS — K219 Gastro-esophageal reflux disease without esophagitis: Secondary | ICD-10-CM | POA: Diagnosis not present

## 2018-11-07 DIAGNOSIS — N3001 Acute cystitis with hematuria: Secondary | ICD-10-CM | POA: Diagnosis not present

## 2018-11-07 DIAGNOSIS — N400 Enlarged prostate without lower urinary tract symptoms: Secondary | ICD-10-CM | POA: Diagnosis not present

## 2018-11-07 DIAGNOSIS — Z9181 History of falling: Secondary | ICD-10-CM | POA: Diagnosis not present

## 2018-11-07 DIAGNOSIS — I5043 Acute on chronic combined systolic (congestive) and diastolic (congestive) heart failure: Secondary | ICD-10-CM | POA: Diagnosis not present

## 2018-11-07 DIAGNOSIS — E1122 Type 2 diabetes mellitus with diabetic chronic kidney disease: Secondary | ICD-10-CM | POA: Diagnosis not present

## 2018-11-07 DIAGNOSIS — Z79899 Other long term (current) drug therapy: Secondary | ICD-10-CM | POA: Diagnosis not present

## 2018-11-07 DIAGNOSIS — Z792 Long term (current) use of antibiotics: Secondary | ICD-10-CM | POA: Diagnosis not present

## 2018-11-07 DIAGNOSIS — F0390 Unspecified dementia without behavioral disturbance: Secondary | ICD-10-CM | POA: Diagnosis not present

## 2018-11-07 DIAGNOSIS — Z7984 Long term (current) use of oral hypoglycemic drugs: Secondary | ICD-10-CM | POA: Diagnosis not present

## 2018-11-07 DIAGNOSIS — Z8673 Personal history of transient ischemic attack (TIA), and cerebral infarction without residual deficits: Secondary | ICD-10-CM | POA: Diagnosis not present

## 2018-11-07 DIAGNOSIS — H401231 Low-tension glaucoma, bilateral, mild stage: Secondary | ICD-10-CM | POA: Diagnosis not present

## 2018-11-07 DIAGNOSIS — H353131 Nonexudative age-related macular degeneration, bilateral, early dry stage: Secondary | ICD-10-CM | POA: Diagnosis not present

## 2018-11-07 DIAGNOSIS — Z79891 Long term (current) use of opiate analgesic: Secondary | ICD-10-CM | POA: Diagnosis not present

## 2018-11-07 DIAGNOSIS — R4789 Other speech disturbances: Secondary | ICD-10-CM | POA: Diagnosis not present

## 2018-11-07 DIAGNOSIS — Z7982 Long term (current) use of aspirin: Secondary | ICD-10-CM | POA: Diagnosis not present

## 2018-11-07 DIAGNOSIS — E785 Hyperlipidemia, unspecified: Secondary | ICD-10-CM | POA: Diagnosis not present

## 2018-11-07 DIAGNOSIS — N1831 Chronic kidney disease, stage 3a: Secondary | ICD-10-CM | POA: Diagnosis not present

## 2018-11-07 DIAGNOSIS — D631 Anemia in chronic kidney disease: Secondary | ICD-10-CM | POA: Diagnosis not present

## 2018-11-07 DIAGNOSIS — I2510 Atherosclerotic heart disease of native coronary artery without angina pectoris: Secondary | ICD-10-CM | POA: Diagnosis not present

## 2018-11-07 DIAGNOSIS — I130 Hypertensive heart and chronic kidney disease with heart failure and stage 1 through stage 4 chronic kidney disease, or unspecified chronic kidney disease: Secondary | ICD-10-CM | POA: Diagnosis not present

## 2018-11-12 DIAGNOSIS — E1122 Type 2 diabetes mellitus with diabetic chronic kidney disease: Secondary | ICD-10-CM | POA: Diagnosis not present

## 2018-11-12 DIAGNOSIS — I5043 Acute on chronic combined systolic (congestive) and diastolic (congestive) heart failure: Secondary | ICD-10-CM | POA: Diagnosis not present

## 2018-11-12 DIAGNOSIS — F0390 Unspecified dementia without behavioral disturbance: Secondary | ICD-10-CM | POA: Diagnosis not present

## 2018-11-12 DIAGNOSIS — D631 Anemia in chronic kidney disease: Secondary | ICD-10-CM | POA: Diagnosis not present

## 2018-11-12 DIAGNOSIS — I130 Hypertensive heart and chronic kidney disease with heart failure and stage 1 through stage 4 chronic kidney disease, or unspecified chronic kidney disease: Secondary | ICD-10-CM | POA: Diagnosis not present

## 2018-11-12 DIAGNOSIS — N1831 Chronic kidney disease, stage 3a: Secondary | ICD-10-CM | POA: Diagnosis not present

## 2018-11-17 DIAGNOSIS — I130 Hypertensive heart and chronic kidney disease with heart failure and stage 1 through stage 4 chronic kidney disease, or unspecified chronic kidney disease: Secondary | ICD-10-CM | POA: Diagnosis not present

## 2018-11-18 DIAGNOSIS — I130 Hypertensive heart and chronic kidney disease with heart failure and stage 1 through stage 4 chronic kidney disease, or unspecified chronic kidney disease: Secondary | ICD-10-CM | POA: Diagnosis not present

## 2018-11-18 DIAGNOSIS — I5043 Acute on chronic combined systolic (congestive) and diastolic (congestive) heart failure: Secondary | ICD-10-CM | POA: Diagnosis not present

## 2018-11-18 DIAGNOSIS — E1122 Type 2 diabetes mellitus with diabetic chronic kidney disease: Secondary | ICD-10-CM | POA: Diagnosis not present

## 2018-11-18 DIAGNOSIS — D631 Anemia in chronic kidney disease: Secondary | ICD-10-CM | POA: Diagnosis not present

## 2018-11-18 DIAGNOSIS — N1831 Chronic kidney disease, stage 3a: Secondary | ICD-10-CM | POA: Diagnosis not present

## 2018-11-18 DIAGNOSIS — F0390 Unspecified dementia without behavioral disturbance: Secondary | ICD-10-CM | POA: Diagnosis not present

## 2018-11-25 DIAGNOSIS — I5043 Acute on chronic combined systolic (congestive) and diastolic (congestive) heart failure: Secondary | ICD-10-CM | POA: Diagnosis not present

## 2018-11-25 DIAGNOSIS — E1122 Type 2 diabetes mellitus with diabetic chronic kidney disease: Secondary | ICD-10-CM | POA: Diagnosis not present

## 2018-11-25 DIAGNOSIS — I130 Hypertensive heart and chronic kidney disease with heart failure and stage 1 through stage 4 chronic kidney disease, or unspecified chronic kidney disease: Secondary | ICD-10-CM | POA: Diagnosis not present

## 2018-11-25 DIAGNOSIS — D631 Anemia in chronic kidney disease: Secondary | ICD-10-CM | POA: Diagnosis not present

## 2018-11-25 DIAGNOSIS — F0390 Unspecified dementia without behavioral disturbance: Secondary | ICD-10-CM | POA: Diagnosis not present

## 2018-11-25 DIAGNOSIS — N1831 Chronic kidney disease, stage 3a: Secondary | ICD-10-CM | POA: Diagnosis not present

## 2018-12-03 DIAGNOSIS — Z0000 Encounter for general adult medical examination without abnormal findings: Secondary | ICD-10-CM | POA: Diagnosis not present

## 2018-12-03 DIAGNOSIS — I130 Hypertensive heart and chronic kidney disease with heart failure and stage 1 through stage 4 chronic kidney disease, or unspecified chronic kidney disease: Secondary | ICD-10-CM | POA: Diagnosis not present

## 2018-12-03 DIAGNOSIS — I6522 Occlusion and stenosis of left carotid artery: Secondary | ICD-10-CM | POA: Diagnosis not present

## 2018-12-03 DIAGNOSIS — I6523 Occlusion and stenosis of bilateral carotid arteries: Secondary | ICD-10-CM | POA: Diagnosis not present

## 2018-12-07 DIAGNOSIS — K589 Irritable bowel syndrome without diarrhea: Secondary | ICD-10-CM | POA: Diagnosis not present

## 2018-12-07 DIAGNOSIS — Z79899 Other long term (current) drug therapy: Secondary | ICD-10-CM | POA: Diagnosis not present

## 2018-12-07 DIAGNOSIS — E785 Hyperlipidemia, unspecified: Secondary | ICD-10-CM | POA: Diagnosis not present

## 2018-12-07 DIAGNOSIS — Z79891 Long term (current) use of opiate analgesic: Secondary | ICD-10-CM | POA: Diagnosis not present

## 2018-12-07 DIAGNOSIS — E1151 Type 2 diabetes mellitus with diabetic peripheral angiopathy without gangrene: Secondary | ICD-10-CM | POA: Diagnosis not present

## 2018-12-07 DIAGNOSIS — N400 Enlarged prostate without lower urinary tract symptoms: Secondary | ICD-10-CM | POA: Diagnosis not present

## 2018-12-07 DIAGNOSIS — E1122 Type 2 diabetes mellitus with diabetic chronic kidney disease: Secondary | ICD-10-CM | POA: Diagnosis not present

## 2018-12-07 DIAGNOSIS — E1165 Type 2 diabetes mellitus with hyperglycemia: Secondary | ICD-10-CM | POA: Diagnosis not present

## 2018-12-07 DIAGNOSIS — I2510 Atherosclerotic heart disease of native coronary artery without angina pectoris: Secondary | ICD-10-CM | POA: Diagnosis not present

## 2018-12-07 DIAGNOSIS — Z792 Long term (current) use of antibiotics: Secondary | ICD-10-CM | POA: Diagnosis not present

## 2018-12-07 DIAGNOSIS — I252 Old myocardial infarction: Secondary | ICD-10-CM | POA: Diagnosis not present

## 2018-12-07 DIAGNOSIS — Z7982 Long term (current) use of aspirin: Secondary | ICD-10-CM | POA: Diagnosis not present

## 2018-12-07 DIAGNOSIS — Z8673 Personal history of transient ischemic attack (TIA), and cerebral infarction without residual deficits: Secondary | ICD-10-CM | POA: Diagnosis not present

## 2018-12-07 DIAGNOSIS — H401231 Low-tension glaucoma, bilateral, mild stage: Secondary | ICD-10-CM | POA: Diagnosis not present

## 2018-12-07 DIAGNOSIS — I5043 Acute on chronic combined systolic (congestive) and diastolic (congestive) heart failure: Secondary | ICD-10-CM | POA: Diagnosis not present

## 2018-12-07 DIAGNOSIS — N1831 Chronic kidney disease, stage 3a: Secondary | ICD-10-CM | POA: Diagnosis not present

## 2018-12-07 DIAGNOSIS — D631 Anemia in chronic kidney disease: Secondary | ICD-10-CM | POA: Diagnosis not present

## 2018-12-07 DIAGNOSIS — Z7984 Long term (current) use of oral hypoglycemic drugs: Secondary | ICD-10-CM | POA: Diagnosis not present

## 2018-12-07 DIAGNOSIS — K219 Gastro-esophageal reflux disease without esophagitis: Secondary | ICD-10-CM | POA: Diagnosis not present

## 2018-12-07 DIAGNOSIS — Z9181 History of falling: Secondary | ICD-10-CM | POA: Diagnosis not present

## 2018-12-07 DIAGNOSIS — H353131 Nonexudative age-related macular degeneration, bilateral, early dry stage: Secondary | ICD-10-CM | POA: Diagnosis not present

## 2018-12-07 DIAGNOSIS — N3001 Acute cystitis with hematuria: Secondary | ICD-10-CM | POA: Diagnosis not present

## 2018-12-07 DIAGNOSIS — R4789 Other speech disturbances: Secondary | ICD-10-CM | POA: Diagnosis not present

## 2018-12-07 DIAGNOSIS — F0390 Unspecified dementia without behavioral disturbance: Secondary | ICD-10-CM | POA: Diagnosis not present

## 2018-12-07 DIAGNOSIS — I130 Hypertensive heart and chronic kidney disease with heart failure and stage 1 through stage 4 chronic kidney disease, or unspecified chronic kidney disease: Secondary | ICD-10-CM | POA: Diagnosis not present

## 2018-12-08 DIAGNOSIS — R600 Localized edema: Secondary | ICD-10-CM | POA: Diagnosis not present

## 2018-12-08 DIAGNOSIS — E119 Type 2 diabetes mellitus without complications: Secondary | ICD-10-CM | POA: Diagnosis not present

## 2018-12-08 DIAGNOSIS — R5383 Other fatigue: Secondary | ICD-10-CM | POA: Diagnosis not present

## 2018-12-08 DIAGNOSIS — I2510 Atherosclerotic heart disease of native coronary artery without angina pectoris: Secondary | ICD-10-CM | POA: Diagnosis not present

## 2018-12-08 DIAGNOSIS — I429 Cardiomyopathy, unspecified: Secondary | ICD-10-CM | POA: Diagnosis not present

## 2018-12-08 DIAGNOSIS — I10 Essential (primary) hypertension: Secondary | ICD-10-CM | POA: Diagnosis not present

## 2018-12-09 DIAGNOSIS — N1831 Chronic kidney disease, stage 3a: Secondary | ICD-10-CM | POA: Diagnosis not present

## 2018-12-09 DIAGNOSIS — F0390 Unspecified dementia without behavioral disturbance: Secondary | ICD-10-CM | POA: Diagnosis not present

## 2018-12-09 DIAGNOSIS — D631 Anemia in chronic kidney disease: Secondary | ICD-10-CM | POA: Diagnosis not present

## 2018-12-09 DIAGNOSIS — I5043 Acute on chronic combined systolic (congestive) and diastolic (congestive) heart failure: Secondary | ICD-10-CM | POA: Diagnosis not present

## 2018-12-09 DIAGNOSIS — I130 Hypertensive heart and chronic kidney disease with heart failure and stage 1 through stage 4 chronic kidney disease, or unspecified chronic kidney disease: Secondary | ICD-10-CM | POA: Diagnosis not present

## 2018-12-09 DIAGNOSIS — E1122 Type 2 diabetes mellitus with diabetic chronic kidney disease: Secondary | ICD-10-CM | POA: Diagnosis not present

## 2018-12-15 DEATH — deceased
